# Patient Record
Sex: Female | Born: 1950 | Race: Black or African American | Hispanic: No | Marital: Married | Smoking: Never smoker
Health system: Southern US, Community
[De-identification: ages and names within clinical notes are randomized; demographics above are authoritative.]

## PROBLEM LIST (undated history)

## (undated) DIAGNOSIS — C801 Malignant (primary) neoplasm, unspecified: Secondary | ICD-10-CM

## (undated) DIAGNOSIS — R7303 Prediabetes: Secondary | ICD-10-CM

## (undated) DIAGNOSIS — E785 Hyperlipidemia, unspecified: Secondary | ICD-10-CM

## (undated) DIAGNOSIS — K5792 Diverticulitis of intestine, part unspecified, without perforation or abscess without bleeding: Secondary | ICD-10-CM

## (undated) DIAGNOSIS — I10 Essential (primary) hypertension: Secondary | ICD-10-CM

## (undated) DIAGNOSIS — C50919 Malignant neoplasm of unspecified site of unspecified female breast: Secondary | ICD-10-CM

## (undated) DIAGNOSIS — Z923 Personal history of irradiation: Secondary | ICD-10-CM

## (undated) HISTORY — PX: ABDOMINAL HYSTERECTOMY: SHX81

## (undated) HISTORY — DX: Prediabetes: R73.03

---

## 2004-10-16 ENCOUNTER — Ambulatory Visit: Payer: Self-pay | Admitting: Internal Medicine

## 2004-11-16 ENCOUNTER — Ambulatory Visit: Payer: Self-pay

## 2005-10-24 ENCOUNTER — Ambulatory Visit: Payer: Self-pay

## 2006-11-07 ENCOUNTER — Ambulatory Visit: Payer: Self-pay

## 2007-06-19 ENCOUNTER — Ambulatory Visit: Payer: Self-pay | Admitting: Gastroenterology

## 2007-12-30 ENCOUNTER — Ambulatory Visit: Payer: Self-pay

## 2009-01-18 ENCOUNTER — Ambulatory Visit: Payer: Self-pay

## 2010-02-01 ENCOUNTER — Ambulatory Visit: Payer: Self-pay | Admitting: Obstetrics and Gynecology

## 2010-09-28 ENCOUNTER — Observation Stay: Payer: Self-pay | Admitting: Family Medicine

## 2011-04-11 ENCOUNTER — Ambulatory Visit: Payer: Self-pay | Admitting: Obstetrics and Gynecology

## 2012-04-14 ENCOUNTER — Ambulatory Visit: Payer: Self-pay | Admitting: Obstetrics and Gynecology

## 2012-04-23 ENCOUNTER — Ambulatory Visit: Payer: Self-pay | Admitting: Obstetrics and Gynecology

## 2012-11-11 ENCOUNTER — Ambulatory Visit: Payer: Self-pay | Admitting: Family Medicine

## 2013-04-21 ENCOUNTER — Ambulatory Visit: Payer: Self-pay | Admitting: Obstetrics and Gynecology

## 2014-06-28 ENCOUNTER — Ambulatory Visit: Payer: Self-pay | Admitting: Obstetrics and Gynecology

## 2014-06-30 DIAGNOSIS — E7800 Pure hypercholesterolemia, unspecified: Secondary | ICD-10-CM | POA: Insufficient documentation

## 2014-06-30 DIAGNOSIS — I10 Essential (primary) hypertension: Secondary | ICD-10-CM | POA: Insufficient documentation

## 2014-07-22 DIAGNOSIS — R7303 Prediabetes: Secondary | ICD-10-CM | POA: Insufficient documentation

## 2014-08-12 ENCOUNTER — Encounter (HOSPITAL_COMMUNITY): Payer: Self-pay | Admitting: Pharmacy Technician

## 2014-08-18 NOTE — Patient Instructions (Signed)
Your procedure is scheduled on: 08/25/2014  Report to [facility] at  800  AM.  Call this number if you have problems the morning of surgery: [phone]   Do not eat food or drink liquids :After Midnight.      Take these medicines the morning of surgery with A SIP OF WATER: lisinopril   Do not wear jewelry, make-up or nail polish.  Do not wear lotions, powders, or perfumes.   Do not shave 48 hours prior to surgery.  Do not bring valuables to the hospital.  Contacts, dentures or bridgework may not be worn into surgery.  Leave suitcase in the car. After surgery it may be brought to your room.  For patients admitted to the hospital, checkout time is 11:00 AM the day of discharge.   Patients discharged the day of surgery will not be allowed to drive home.  :     Please read over the following fact sheets that you were given: Coughing and Deep Breathing, Surgical Site Infection Prevention, Anesthesia Post-op Instructions and Care and Recovery After Surgery    Cataract A cataract is a clouding of the lens of the eye. When a lens becomes cloudy, vision is reduced based on the degree and nature of the clouding. Many cataracts reduce vision to some degree. Some cataracts make people more near-sighted as they develop. Other cataracts increase glare. Cataracts that are ignored and become worse can sometimes look white. The white color can be seen through the pupil. CAUSES   Aging. However, cataracts may occur at any age, even in newborns.   Certain drugs.   Trauma to the eye.   Certain diseases such as diabetes.   Specific eye diseases such as chronic inflammation inside the eye or a sudden attack of a rare form of glaucoma.   Inherited or acquired medical problems.  SYMPTOMS   Gradual, progressive drop in vision in the affected eye.   Severe, rapid visual loss. This most often happens when trauma is the cause.  DIAGNOSIS  To detect a cataract, an eye doctor examines the lens. Cataracts  are best diagnosed with an exam of the eyes with the pupils enlarged (dilated) by drops.  TREATMENT  For an early cataract, vision may improve by using different eyeglasses or stronger lighting. If that does not help your vision, surgery is the only effective treatment. A cataract needs to be surgically removed when vision loss interferes with your everyday activities, such as driving, reading, or watching TV. A cataract may also have to be removed if it prevents examination or treatment of another eye problem. Surgery removes the cloudy lens and usually replaces it with a substitute lens (intraocular lens, IOL).  At a time when both you and your doctor agree, the cataract will be surgically removed. If you have cataracts in both eyes, only one is usually removed at a time. This allows the operated eye to heal and be out of danger from any possible problems after surgery (such as infection or poor wound healing). In rare cases, a cataract may be doing damage to your eye. In these cases, your caregiver may advise surgical removal right away. The vast majority of people who have cataract surgery have better vision afterward. HOME CARE INSTRUCTIONS  If you are not planning surgery, you may be asked to do the following:  Use different eyeglasses.   Use stronger or brighter lighting.   Ask your eye doctor about reducing your medicine dose or changing medicines if  it is thought that a medicine caused your cataract. Changing medicines does not make the cataract go away on its own.   Become familiar with your surroundings. Poor vision can lead to injury. Avoid bumping into things on the affected side. You are at a higher risk for tripping or falling.   Exercise extreme care when driving or operating machinery.   Wear sunglasses if you are sensitive to bright light or experiencing problems with glare.  SEEK IMMEDIATE MEDICAL CARE IF:   You have a worsening or sudden vision loss.   You notice redness,  swelling, or increasing pain in the eye.   You have a fever.  Document Released: 12/16/2005 Document Revised: 12/05/2011 Document Reviewed: 08/09/2011 [facility] Patient Information 2012 [place].PATIENT INSTRUCTIONS POST-ANESTHESIA  IMMEDIATELY FOLLOWING SURGERY:  Do not drive or operate machinery for the first twenty four hours after surgery.  Do not make any important decisions for twenty four hours after surgery or while taking narcotic pain medications or sedatives.  If you develop intractable nausea and vomiting or a severe headache please notify your doctor immediately.  FOLLOW-UP:  Please make an appointment with your surgeon as instructed. You do not need to follow up with anesthesia unless specifically instructed to do so.  WOUND CARE INSTRUCTIONS (if applicable):  Keep a dry clean dressing on the anesthesia/puncture wound site if there is drainage.  Once the wound has quit draining you may leave it open to air.  Generally you should leave the bandage intact for twenty four hours unless there is drainage.  If the epidural site drains for more than 36-48 hours please call the anesthesia department.  QUESTIONS?:  Please feel free to call your physician or the hospital operator if you have any questions, and they will be happy to assist you.

## 2014-08-19 ENCOUNTER — Encounter (HOSPITAL_COMMUNITY)
Admission: RE | Admit: 2014-08-19 | Discharge: 2014-08-19 | Disposition: A | Discharge status: Home / Self Care | Payer: BC Managed Care – PPO | Source: Ambulatory Visit | Attending: Ophthalmology | Admitting: Ophthalmology

## 2014-08-19 ENCOUNTER — Encounter (HOSPITAL_COMMUNITY): Payer: Self-pay

## 2014-08-19 DIAGNOSIS — Z01812 Encounter for preprocedural laboratory examination: Secondary | ICD-10-CM | POA: Insufficient documentation

## 2014-08-19 DIAGNOSIS — H2589 Other age-related cataract: Secondary | ICD-10-CM | POA: Diagnosis not present

## 2014-08-19 DIAGNOSIS — Z01818 Encounter for other preprocedural examination: Secondary | ICD-10-CM | POA: Diagnosis not present

## 2014-08-19 DIAGNOSIS — I10 Essential (primary) hypertension: Secondary | ICD-10-CM | POA: Insufficient documentation

## 2014-08-19 DIAGNOSIS — Z01810 Encounter for preprocedural cardiovascular examination: Secondary | ICD-10-CM | POA: Insufficient documentation

## 2014-08-19 HISTORY — DX: Essential (primary) hypertension: I10

## 2014-08-19 LAB — BASIC METABOLIC PANEL
Anion gap: 12 (ref 5–15)
BUN: 12 mg/dL (ref 6–23)
CHLORIDE: 101 meq/L (ref 96–112)
CO2: 28 mEq/L (ref 19–32)
Calcium: 10.4 mg/dL (ref 8.4–10.5)
Creatinine, Ser: 1.04 mg/dL (ref 0.50–1.10)
GFR, EST AFRICAN AMERICAN: 65 mL/min — AB (ref >90)
GFR, EST NON AFRICAN AMERICAN: 56 mL/min — AB (ref >90)
Glucose, Bld: 86 mg/dL (ref 70–99)
Potassium: 3.9 mEq/L (ref 3.7–5.3)
SODIUM: 141 meq/L (ref 137–147)

## 2014-08-19 LAB — HEMOGLOBIN AND HEMATOCRIT, BLOOD
HCT: 40.9 % (ref 36.0–46.0)
Hemoglobin: 13.5 g/dL (ref 12.0–15.0)

## 2014-08-24 MED ORDER — TETRACAINE HCL 0.5 % OP SOLN
OPHTHALMIC | Status: AC
  Filled 2014-08-24: qty 2

## 2014-08-24 MED ORDER — NEOMYCIN-POLYMYXIN-DEXAMETH 3.5-10000-0.1 OP SUSP
OPHTHALMIC | Status: AC
  Filled 2014-08-24: qty 5

## 2014-08-24 MED ORDER — CYCLOPENTOLATE-PHENYLEPHRINE OP SOLN OPTIME - NO CHARGE
OPHTHALMIC | Status: AC
  Filled 2014-08-24: qty 2

## 2014-08-24 MED ORDER — PHENYLEPHRINE HCL 2.5 % OP SOLN
OPHTHALMIC | Status: AC
  Filled 2014-08-24: qty 15

## 2014-08-24 MED ORDER — LIDOCAINE HCL (PF) 1 % IJ SOLN
INTRAMUSCULAR | Status: AC
  Filled 2014-08-24: qty 2

## 2014-08-24 MED ORDER — LIDOCAINE HCL 3.5 % OP GEL
OPHTHALMIC | Status: AC
  Filled 2014-08-24: qty 1

## 2014-08-25 ENCOUNTER — Ambulatory Visit (HOSPITAL_COMMUNITY)
Admission: RE | Admit: 2014-08-25 | Discharge: 2014-08-25 | Disposition: A | Discharge status: Home / Self Care | Payer: BC Managed Care – PPO | Source: Ambulatory Visit | Attending: Ophthalmology | Admitting: Ophthalmology

## 2014-08-25 ENCOUNTER — Encounter (HOSPITAL_COMMUNITY): Payer: BC Managed Care – PPO | Admitting: Anesthesiology

## 2014-08-25 ENCOUNTER — Encounter (HOSPITAL_COMMUNITY): Payer: Self-pay | Admitting: *Deleted

## 2014-08-25 ENCOUNTER — Ambulatory Visit (HOSPITAL_COMMUNITY): Payer: BC Managed Care – PPO | Admitting: Anesthesiology

## 2014-08-25 ENCOUNTER — Encounter (HOSPITAL_COMMUNITY)
Admission: RE | Disposition: A | Discharge status: Home / Self Care | Payer: Self-pay | Source: Ambulatory Visit | Attending: Ophthalmology

## 2014-08-25 DIAGNOSIS — Z79899 Other long term (current) drug therapy: Secondary | ICD-10-CM | POA: Insufficient documentation

## 2014-08-25 DIAGNOSIS — H2589 Other age-related cataract: Secondary | ICD-10-CM | POA: Insufficient documentation

## 2014-08-25 DIAGNOSIS — Z7982 Long term (current) use of aspirin: Secondary | ICD-10-CM | POA: Insufficient documentation

## 2014-08-25 DIAGNOSIS — I10 Essential (primary) hypertension: Secondary | ICD-10-CM | POA: Diagnosis not present

## 2014-08-25 HISTORY — PX: CATARACT EXTRACTION W/PHACO: SHX586

## 2014-08-25 SURGERY — PHACOEMULSIFICATION, CATARACT, WITH IOL INSERTION
Anesthesia: Monitor Anesthesia Care | Site: Eye | Laterality: Right

## 2014-08-25 MED ORDER — FENTANYL CITRATE 0.05 MG/ML IJ SOLN
INTRAMUSCULAR | Status: AC
  Filled 2014-08-25: qty 2

## 2014-08-25 MED ORDER — PROVISC 10 MG/ML IO SOLN
INTRAOCULAR | Status: DC | PRN
  Administered 2014-08-25: 0.85 mL via INTRAOCULAR

## 2014-08-25 MED ORDER — PHENYLEPHRINE HCL 2.5 % OP SOLN
1.0000 [drp] | OPHTHALMIC | Status: AC
  Administered 2014-08-25 (×3): 1 [drp] via OPHTHALMIC

## 2014-08-25 MED ORDER — CYCLOPENTOLATE-PHENYLEPHRINE 0.2-1 % OP SOLN
1.0000 [drp] | OPHTHALMIC | Status: AC
  Administered 2014-08-25 (×3): 1 [drp] via OPHTHALMIC

## 2014-08-25 MED ORDER — FENTANYL CITRATE 0.05 MG/ML IJ SOLN
25.0000 ug | INTRAMUSCULAR | Status: AC
  Administered 2014-08-25 (×2): 25 ug via INTRAVENOUS

## 2014-08-25 MED ORDER — NEOMYCIN-POLYMYXIN-DEXAMETH 3.5-10000-0.1 OP SUSP
OPHTHALMIC | Status: DC | PRN
  Administered 2014-08-25: 2 [drp] via OPHTHALMIC

## 2014-08-25 MED ORDER — LIDOCAINE HCL 3.5 % OP GEL
1.0000 "application " | Freq: Once | OPHTHALMIC | Status: AC
  Administered 2014-08-25: 1 via OPHTHALMIC

## 2014-08-25 MED ORDER — TETRACAINE HCL 0.5 % OP SOLN
1.0000 [drp] | OPHTHALMIC | Status: AC | PRN
  Administered 2014-08-25 (×3): 1 [drp] via OPHTHALMIC

## 2014-08-25 MED ORDER — EPINEPHRINE HCL 1 MG/ML IJ SOLN
INTRAMUSCULAR | Status: AC
  Filled 2014-08-25: qty 1

## 2014-08-25 MED ORDER — LIDOCAINE HCL (PF) 1 % IJ SOLN
INTRAMUSCULAR | Status: DC | PRN
  Administered 2014-08-25: .4 mL

## 2014-08-25 MED ORDER — LACTATED RINGERS IV SOLN
INTRAVENOUS | Status: DC
  Administered 2014-08-25: 1000 mL via INTRAVENOUS

## 2014-08-25 MED ORDER — LIDOCAINE 3.5 % OP GEL OPTIME - NO CHARGE
OPHTHALMIC | Status: DC | PRN
  Administered 2014-08-25: 1 [drp] via OPHTHALMIC

## 2014-08-25 MED ORDER — EPINEPHRINE HCL 1 MG/ML IJ SOLN
INTRAOCULAR | Status: DC | PRN
  Administered 2014-08-25: 09:00:00

## 2014-08-25 MED ORDER — POVIDONE-IODINE 5 % OP SOLN
OPHTHALMIC | Status: DC | PRN
  Administered 2014-08-25: 1 via OPHTHALMIC

## 2014-08-25 MED ORDER — MIDAZOLAM HCL 2 MG/2ML IJ SOLN
1.0000 mg | INTRAMUSCULAR | Status: DC | PRN
  Administered 2014-08-25: 2 mg via INTRAVENOUS

## 2014-08-25 MED ORDER — MIDAZOLAM HCL 2 MG/2ML IJ SOLN
INTRAMUSCULAR | Status: AC
  Filled 2014-08-25: qty 2

## 2014-08-25 MED ORDER — BSS IO SOLN
INTRAOCULAR | Status: DC | PRN
  Administered 2014-08-25: 15 mL

## 2014-08-25 SURGICAL SUPPLY — 34 items
CAPSULAR TENSION RING-AMO (OPHTHALMIC RELATED) IMPLANT
CLOTH BEACON ORANGE TIMEOUT ST (SAFETY) ×3 IMPLANT
EYE SHIELD UNIVERSAL CLEAR (GAUZE/BANDAGES/DRESSINGS) ×3 IMPLANT
GLOVE BIO SURGEON STRL SZ 6.5 (GLOVE) IMPLANT
GLOVE BIO SURGEONS STRL SZ 6.5 (GLOVE)
GLOVE BIOGEL PI IND STRL 6.5 (GLOVE) IMPLANT
GLOVE BIOGEL PI IND STRL 7.0 (GLOVE) ×1 IMPLANT
GLOVE BIOGEL PI IND STRL 7.5 (GLOVE) IMPLANT
GLOVE BIOGEL PI INDICATOR 6.5 (GLOVE)
GLOVE BIOGEL PI INDICATOR 7.0 (GLOVE) ×2
GLOVE BIOGEL PI INDICATOR 7.5 (GLOVE)
GLOVE ECLIPSE 6.5 STRL STRAW (GLOVE) IMPLANT
GLOVE ECLIPSE 7.0 STRL STRAW (GLOVE) IMPLANT
GLOVE ECLIPSE 7.5 STRL STRAW (GLOVE) IMPLANT
GLOVE EXAM NITRILE LRG STRL (GLOVE) IMPLANT
GLOVE EXAM NITRILE MD LF STRL (GLOVE) ×3 IMPLANT
GLOVE SKINSENSE NS SZ6.5 (GLOVE)
GLOVE SKINSENSE NS SZ7.0 (GLOVE)
GLOVE SKINSENSE STRL SZ6.5 (GLOVE) IMPLANT
GLOVE SKINSENSE STRL SZ7.0 (GLOVE) IMPLANT
KIT VITRECTOMY (OPHTHALMIC RELATED) IMPLANT
PAD ARMBOARD 7.5X6 YLW CONV (MISCELLANEOUS) ×3 IMPLANT
PROC W NO LENS (INTRAOCULAR LENS)
PROC W SPEC LENS (INTRAOCULAR LENS)
PROCESS W NO LENS (INTRAOCULAR LENS) IMPLANT
PROCESS W SPEC LENS (INTRAOCULAR LENS) IMPLANT
RETRACTOR IRIS SIGHTPATH (OPHTHALMIC RELATED) IMPLANT
RING MALYGIN (MISCELLANEOUS) IMPLANT
SIGHTPATH CAT PROC W REG LENS (Ophthalmic Related) ×3 IMPLANT
SYRINGE LUER LOK 1CC (MISCELLANEOUS) ×3 IMPLANT
TAPE SURG TRANSPORE 1 IN (GAUZE/BANDAGES/DRESSINGS) ×1 IMPLANT
TAPE SURGICAL TRANSPORE 1 IN (GAUZE/BANDAGES/DRESSINGS) ×2
VISCOELASTIC ADDITIONAL (OPHTHALMIC RELATED) IMPLANT
WATER STERILE IRR 250ML POUR (IV SOLUTION) ×3 IMPLANT

## 2014-08-25 NOTE — Transfer of Care (Signed)
Immediate Anesthesia Transfer of Care Note  Patient: Laura Johns  Procedure(s) Performed: Procedure(s) with comments: CATARACT EXTRACTION PHACO AND INTRAOCULAR LENS PLACEMENT (IOC) (Right) - CDE 5.82  Patient Location: Short Stay  Anesthesia Type:MAC  Level of Consciousness: awake, alert , oriented and patient cooperative  Airway & Oxygen Therapy: Patient Spontanous Breathing  Post-op Assessment: Report given to PACU RN, Post -op Vital signs reviewed and stable and Patient moving all extremities  Post vital signs: Reviewed and stable  Complications: No apparent anesthesia complications

## 2014-08-25 NOTE — Op Note (Signed)
Date of Admission: 08/25/2014  Date of Surgery: 08/25/2014   Pre-Op Dx: Cataract Right Eye  Post-Op Dx: Senile Combined  Cataract Right  Eye,  Dx Code 366.19  Surgeon: Tonny Branch, M.D.  Assistants: None  Anesthesia: Topical with MAC  Indications: Painless, progressive loss of vision with compromise of daily activities.  Surgery: Cataract Extraction with Intraocular lens Implant Right Eye  Discription: The patient had dilating drops and viscous lidocaine placed into the Right eye in the pre-op holding area. After transfer to the operating room, a time out was performed. The patient was then prepped and draped. Beginning with a 50 degree blade a paracentesis port was made at the surgeon's 2 o'clock position. The anterior chamber was then filled with 1% non-preserved lidocaine. This was followed by filling the anterior chamber with Provisc.  A 2.54mm keratome blade was used to make a clear corneal incision at the temporal limbus.  A bent cystatome needle was used to create a continuous tear capsulotomy. Hydrodissection was performed with balanced salt solution on a Fine canula. The lens nucleus was then removed using the phacoemulsification handpiece. Residual cortex was removed with the I&A handpiece. The anterior chamber and capsular bag were refilled with Provisc. A posterior chamber intraocular lens was placed into the capsular bag with it's injector. The implant was positioned with the Kuglan hook. The Provisc was then removed from the anterior chamber and capsular bag with the I&A handpiece. Stromal hydration of the main incision and paracentesis port was performed with BSS on a Fine canula. The wounds were tested for leak which was negative. The patient tolerated the procedure well. There were no operative complications. The patient was then transferred to the recovery room in stable condition.  Complications: None  Specimen: None  EBL: None  Prosthetic device: Hoya iSert 250, power 18.5  D, SN [id number].

## 2014-08-25 NOTE — Anesthesia Postprocedure Evaluation (Signed)
  Anesthesia Post-op Note  Patient: Laura Johns  Procedure(s) Performed: Procedure(s) with comments: CATARACT EXTRACTION PHACO AND INTRAOCULAR LENS PLACEMENT (IOC) (Right) - CDE 5.82  Patient Location: Short Stay  Anesthesia Type:MAC  Level of Consciousness: awake, alert , oriented and patient cooperative  Airway and Oxygen Therapy: Patient Spontanous Breathing  Post-op Pain: none  Post-op Assessment: Post-op Vital signs reviewed, Patient's Cardiovascular Status Stable, Respiratory Function Stable and Patent Airway  Post-op Vital Signs: Reviewed and stable  Last Vitals:  Filed Vitals:   08/25/14 0845  BP: 128/70  Pulse:   Temp:   Resp: 18    Complications: No apparent anesthesia complications

## 2014-08-25 NOTE — Anesthesia Preprocedure Evaluation (Addendum)
Anesthesia Evaluation  Patient identified by MRN, date of birth, ID band Patient awake    Reviewed: Allergy & Precautions, H&P , NPO status , Patient's Chart, lab work & pertinent test results  Airway Mallampati: II TM Distance: >3 FB     Dental  (+) Teeth Intact   Pulmonary neg pulmonary ROS,  breath sounds clear to auscultation        Cardiovascular hypertension, Pt. on medications Rhythm:Regular Rate:Normal     Neuro/Psych    GI/Hepatic negative GI ROS,   Endo/Other    Renal/GU      Musculoskeletal   Abdominal   Peds  Hematology   Anesthesia Other Findings   Reproductive/Obstetrics                          Anesthesia Physical Anesthesia Plan  ASA: II  Anesthesia Plan: MAC   Post-op Pain Management:    Induction: Intravenous  Airway Management Planned: Nasal Cannula  Additional Equipment:   Intra-op Plan:   Post-operative Plan:   Informed Consent: I have reviewed the patients History and Physical, chart, labs and discussed the procedure including the risks, benefits and alternatives for the proposed anesthesia with the patient or authorized representative who has indicated his/her understanding and acceptance.     Plan Discussed with:   Anesthesia Plan Comments:         Anesthesia Quick Evaluation

## 2014-08-25 NOTE — Discharge Instructions (Signed)

## 2014-08-25 NOTE — H&P (Signed)
I have reviewed the H&P, the patient was re-examined, and I have identified no interval changes in medical condition and plan of care since the history and physical of record  

## 2014-08-26 ENCOUNTER — Encounter (HOSPITAL_COMMUNITY): Payer: Self-pay | Admitting: Ophthalmology

## 2015-04-26 ENCOUNTER — Other Ambulatory Visit: Payer: Self-pay | Admitting: Obstetrics and Gynecology

## 2015-04-26 DIAGNOSIS — Z1231 Encounter for screening mammogram for malignant neoplasm of breast: Secondary | ICD-10-CM

## 2015-07-04 ENCOUNTER — Ambulatory Visit
Admission: RE | Admit: 2015-07-04 | Discharge: 2015-07-04 | Disposition: A | Discharge status: Home / Self Care | Payer: BLUE CROSS/BLUE SHIELD | Source: Ambulatory Visit | Attending: Obstetrics and Gynecology | Admitting: Obstetrics and Gynecology

## 2015-07-04 DIAGNOSIS — Z1231 Encounter for screening mammogram for malignant neoplasm of breast: Secondary | ICD-10-CM | POA: Diagnosis present

## 2016-05-30 ENCOUNTER — Other Ambulatory Visit: Payer: Self-pay | Admitting: Obstetrics and Gynecology

## 2016-05-30 DIAGNOSIS — Z1231 Encounter for screening mammogram for malignant neoplasm of breast: Secondary | ICD-10-CM

## 2016-07-04 ENCOUNTER — Other Ambulatory Visit: Payer: Self-pay | Admitting: Obstetrics and Gynecology

## 2016-07-04 ENCOUNTER — Ambulatory Visit
Admission: RE | Admit: 2016-07-04 | Discharge: 2016-07-04 | Disposition: A | Discharge status: Home / Self Care | Payer: BLUE CROSS/BLUE SHIELD | Source: Ambulatory Visit | Attending: Obstetrics and Gynecology | Admitting: Obstetrics and Gynecology

## 2016-07-04 DIAGNOSIS — Z1231 Encounter for screening mammogram for malignant neoplasm of breast: Secondary | ICD-10-CM | POA: Diagnosis present

## 2017-06-11 ENCOUNTER — Other Ambulatory Visit: Payer: Self-pay | Admitting: Obstetrics and Gynecology

## 2017-06-11 DIAGNOSIS — Z1231 Encounter for screening mammogram for malignant neoplasm of breast: Secondary | ICD-10-CM

## 2017-07-07 ENCOUNTER — Ambulatory Visit
Admission: RE | Admit: 2017-07-07 | Discharge: 2017-07-07 | Disposition: A | Discharge status: Home / Self Care | Payer: BLUE CROSS/BLUE SHIELD | Source: Ambulatory Visit | Attending: Obstetrics and Gynecology | Admitting: Obstetrics and Gynecology

## 2017-07-07 DIAGNOSIS — Z1231 Encounter for screening mammogram for malignant neoplasm of breast: Secondary | ICD-10-CM | POA: Insufficient documentation

## 2017-07-10 ENCOUNTER — Other Ambulatory Visit: Payer: Self-pay | Admitting: Physician Assistant

## 2017-07-10 DIAGNOSIS — R1030 Lower abdominal pain, unspecified: Secondary | ICD-10-CM

## 2017-07-10 DIAGNOSIS — D72829 Elevated white blood cell count, unspecified: Secondary | ICD-10-CM

## 2017-07-11 ENCOUNTER — Ambulatory Visit
Admission: RE | Admit: 2017-07-11 | Discharge: 2017-07-11 | Disposition: A | Discharge status: Home / Self Care | Payer: BLUE CROSS/BLUE SHIELD | Source: Ambulatory Visit | Attending: Physician Assistant | Admitting: Physician Assistant

## 2017-07-11 DIAGNOSIS — R1032 Left lower quadrant pain: Secondary | ICD-10-CM | POA: Insufficient documentation

## 2017-07-11 DIAGNOSIS — D72829 Elevated white blood cell count, unspecified: Secondary | ICD-10-CM | POA: Insufficient documentation

## 2017-07-11 DIAGNOSIS — I700 Atherosclerosis of aorta: Secondary | ICD-10-CM | POA: Insufficient documentation

## 2017-07-11 DIAGNOSIS — I2510 Atherosclerotic heart disease of native coronary artery without angina pectoris: Secondary | ICD-10-CM | POA: Diagnosis not present

## 2017-07-11 DIAGNOSIS — K5732 Diverticulitis of large intestine without perforation or abscess without bleeding: Secondary | ICD-10-CM | POA: Diagnosis not present

## 2017-07-11 DIAGNOSIS — Z90710 Acquired absence of both cervix and uterus: Secondary | ICD-10-CM | POA: Diagnosis not present

## 2017-07-11 DIAGNOSIS — R1030 Lower abdominal pain, unspecified: Secondary | ICD-10-CM

## 2017-07-11 DIAGNOSIS — R1031 Right lower quadrant pain: Secondary | ICD-10-CM | POA: Diagnosis present

## 2017-07-11 MED ORDER — IOPAMIDOL (ISOVUE-300) INJECTION 61%: 100.0000 mL | Freq: Once | INTRAVENOUS | Status: DC | PRN

## 2017-07-12 ENCOUNTER — Emergency Department
Admission: EM | Admit: 2017-07-12 | Discharge: 2017-07-12 | Disposition: A | Discharge status: Home / Self Care | Payer: BLUE CROSS/BLUE SHIELD | Attending: Student in an Organized Health Care Education/Training Program | Admitting: Student in an Organized Health Care Education/Training Program

## 2017-07-12 ENCOUNTER — Emergency Department: Payer: BLUE CROSS/BLUE SHIELD

## 2017-07-12 ENCOUNTER — Encounter: Payer: Self-pay | Admitting: Emergency Medicine

## 2017-07-12 DIAGNOSIS — R1030 Lower abdominal pain, unspecified: Secondary | ICD-10-CM | POA: Diagnosis present

## 2017-07-12 DIAGNOSIS — Z79899 Other long term (current) drug therapy: Secondary | ICD-10-CM | POA: Insufficient documentation

## 2017-07-12 DIAGNOSIS — Z7982 Long term (current) use of aspirin: Secondary | ICD-10-CM | POA: Insufficient documentation

## 2017-07-12 DIAGNOSIS — K5792 Diverticulitis of intestine, part unspecified, without perforation or abscess without bleeding: Secondary | ICD-10-CM

## 2017-07-12 DIAGNOSIS — I10 Essential (primary) hypertension: Secondary | ICD-10-CM | POA: Diagnosis not present

## 2017-07-12 DIAGNOSIS — R109 Unspecified abdominal pain: Secondary | ICD-10-CM

## 2017-07-12 DIAGNOSIS — K5732 Diverticulitis of large intestine without perforation or abscess without bleeding: Secondary | ICD-10-CM | POA: Insufficient documentation

## 2017-07-12 HISTORY — DX: Hyperlipidemia, unspecified: E78.5

## 2017-07-12 LAB — URINALYSIS, COMPLETE (UACMP) WITH MICROSCOPIC
BACTERIA UA: NONE SEEN
Bilirubin Urine: NEGATIVE
Glucose, UA: NEGATIVE mg/dL
Hgb urine dipstick: NEGATIVE
KETONES UR: 5 mg/dL — AB
Nitrite: NEGATIVE
PROTEIN: NEGATIVE mg/dL
Specific Gravity, Urine: 1.015 (ref 1.005–1.030)
pH: 6 (ref 5.0–8.0)

## 2017-07-12 LAB — COMPREHENSIVE METABOLIC PANEL
ALK PHOS: 52 U/L (ref 38–126)
ALT: 13 U/L — ABNORMAL LOW (ref 14–54)
ANION GAP: 10 (ref 5–15)
AST: 19 U/L (ref 15–41)
Albumin: 4.1 g/dL (ref 3.5–5.0)
BUN: 14 mg/dL (ref 6–20)
CALCIUM: 10.6 mg/dL — AB (ref 8.9–10.3)
CHLORIDE: 99 mmol/L — AB (ref 101–111)
CO2: 27 mmol/L (ref 22–32)
Creatinine, Ser: 0.97 mg/dL (ref 0.44–1.00)
GFR calc Af Amer: >60 mL/min (ref >60)
GFR calc non Af Amer: 60 mL/min — ABNORMAL LOW (ref >60)
GLUCOSE: 119 mg/dL — AB (ref 65–99)
Potassium: 3.7 mmol/L (ref 3.5–5.1)
SODIUM: 136 mmol/L (ref 135–145)
Total Bilirubin: 0.9 mg/dL (ref 0.3–1.2)
Total Protein: 7.9 g/dL (ref 6.5–8.1)

## 2017-07-12 LAB — CBC
HEMATOCRIT: 41.5 % (ref 35.0–47.0)
HEMOGLOBIN: 13.7 g/dL (ref 12.0–16.0)
MCH: 25.7 pg — AB (ref 26.0–34.0)
MCHC: 33.1 g/dL (ref 32.0–36.0)
MCV: 77.5 fL — AB (ref 80.0–100.0)
Platelets: 287 10*3/uL (ref 150–440)
RBC: 5.35 MIL/uL — ABNORMAL HIGH (ref 3.80–5.20)
RDW: 14.1 % (ref 11.5–14.5)
WBC: 8.7 10*3/uL (ref 3.6–11.0)

## 2017-07-12 LAB — LIPASE, BLOOD: Lipase: 20 U/L (ref 11–51)

## 2017-07-12 MED ORDER — HYDROCODONE-ACETAMINOPHEN 5-325 MG PO TABS
1.0000 | ORAL_TABLET | Freq: Four times a day (QID) | ORAL | Status: DC | PRN
  Administered 2017-07-12: 1 via ORAL
  Filled 2017-07-12: qty 1

## 2017-07-12 MED ORDER — MORPHINE SULFATE (PF) 4 MG/ML IV SOLN: 4.0000 mg | INTRAVENOUS | Status: DC | PRN

## 2017-07-12 MED ORDER — TRAMADOL HCL 50 MG PO TABS: 50.0000 mg | ORAL_TABLET | Freq: Four times a day (QID) | ORAL | 0 refills | Status: DC | PRN

## 2017-07-12 MED ORDER — ONDANSETRON HCL 4 MG/2ML IJ SOLN
4.0000 mg | Freq: Once | INTRAMUSCULAR | Status: AC
  Administered 2017-07-12: 4 mg via INTRAVENOUS
  Filled 2017-07-12: qty 2

## 2017-07-12 MED ORDER — METRONIDAZOLE IN NACL 5-0.79 MG/ML-% IV SOLN
500.0000 mg | Freq: Once | INTRAVENOUS | Status: AC
  Administered 2017-07-12: 500 mg via INTRAVENOUS
  Filled 2017-07-12: qty 100

## 2017-07-12 MED ORDER — SODIUM CHLORIDE 0.9 % IV BOLUS (SEPSIS)
1000.0000 mL | Freq: Once | INTRAVENOUS | Status: AC
  Administered 2017-07-12: 1000 mL via INTRAVENOUS

## 2017-07-12 MED ORDER — KETOROLAC TROMETHAMINE 30 MG/ML IJ SOLN
15.0000 mg | Freq: Once | INTRAMUSCULAR | Status: AC
  Administered 2017-07-12: 15 mg via INTRAVENOUS
  Filled 2017-07-12: qty 1

## 2017-07-12 MED ORDER — ONDANSETRON 4 MG PO TBDP: 4.0000 mg | ORAL_TABLET | Freq: Three times a day (TID) | ORAL | 0 refills | Status: DC | PRN

## 2017-07-12 MED ORDER — CIPROFLOXACIN IN D5W 200 MG/100ML IV SOLN
200.0000 mg | Freq: Once | INTRAVENOUS | Status: AC
  Administered 2017-07-12: 200 mg via INTRAVENOUS
  Filled 2017-07-12: qty 100

## 2017-07-12 NOTE — ED Triage Notes (Signed)
Pt to ED via POV c/o bilateral lower abdominal pain since Tuesday. Pt was diagnosed on Thursday with acute diverticulitis. Pt is currently being treated with Cipro and Flagyl. Pt states that her pain has not improved. Pt states that she had 1 episode of vomiting this morning.

## 2017-07-12 NOTE — ED Notes (Signed)
Pt given blanket.

## 2017-07-12 NOTE — ED Provider Notes (Signed)
[facility] Emergency Department Provider Note    First MD Initiated Contact with Patient 07/12/17 1730     (approximate)  I have reviewed the triage vital signs and the nursing notes.   HISTORY  Chief Complaint Abdominal Pain    HPI Laura Johns is a 66 y.o. female resents with several days of persistent lower abdominal pain.  Patient with recent diagnosis of diverticulitis was started on Flagyl and Cipro but is having persistent pain. No fevers. Is still having some mild diarrhea. Had an episode of vomiting this morning he has not had anything to eat or drink due to pain. Denies any chest pain. No shortness of breath.  Says the pain is a aching sensation.   Past Medical History:  Diagnosis Date  . Hyperlipemia   . Hypertension    Family History  Problem Relation Age of Onset  . Stroke Mother   . Heart attack Father   . Stroke Father   . Breast cancer Neg Hx    Past Surgical History:  Procedure Laterality Date  . ABDOMINAL HYSTERECTOMY    . CATARACT EXTRACTION W/PHACO Right 08/25/2014   Procedure: CATARACT EXTRACTION PHACO AND INTRAOCULAR LENS PLACEMENT (IOC);  Surgeon: Tonny Branch, MD;  Location: [facility];  Service: Ophthalmology;  Laterality: Right;  CDE 5.82   There are no active problems to display for this patient.     Prior to Admission medications   Medication Sig Start Date End Date Taking? Authorizing Provider  aspirin EC 81 MG tablet Take 81 mg by mouth daily.    [provider]  Calcium Carb-Cholecalciferol (CALCIUM 600+D3) 600-800 MG-UNIT TABS Take 1 tablet by mouth daily.     [provider]  hydrochlorothiazide (HYDRODIURIL) 25 MG tablet Take 25 mg by mouth daily.    [provider]  lisinopril (PRINIVIL,ZESTRIL) 10 MG tablet Take 10 mg by mouth daily.    [provider]  lovastatin (MEVACOR) 40 MG tablet Take 40 mg by mouth at bedtime.    [provider]  Multiple Vitamin  (MULTIVITAMIN WITH MINERALS) TABS tablet Take 1 tablet by mouth daily.    [provider]    Allergies Patient has no known allergies.    Social History Social History  Substance Use Topics  . Smoking status: Never Smoker  . Smokeless tobacco: Never Used  . Alcohol use No    Review of Systems Patient denies headaches, rhinorrhea, blurry vision, numbness, shortness of breath, chest pain, edema, cough, abdominal pain, nausea, vomiting, diarrhea, dysuria, fevers, rashes or hallucinations unless otherwise stated above in HPI. ____________________________________________   PHYSICAL EXAM:  VITAL SIGNS: Vitals:   07/12/17 1308 07/12/17 1615  BP: 125/79 122/75  Pulse: 79 62  Resp: 16 18  Temp: 98.3 F (36.8 C)     Constitutional: Alert and oriented. in no acute distress. Eyes: Conjunctivae are normal.  Head: Atraumatic. Nose: No congestion/rhinnorhea. Mouth/Throat: Mucous membranes are moist.   Neck: No stridor. Painless ROM.  Cardiovascular: Normal rate, regular rhythm. Grossly normal heart sounds.  Good peripheral circulation. Respiratory: Normal respiratory effort.  No retractions. Lungs CTAB. Gastrointestinal: Soft with mild r and L LQ abdominal pain without guarding or rebound. No distention. No abdominal bruits. No CVA tenderness. Musculoskeletal: No lower extremity tenderness nor edema.  No joint effusions. Neurologic:  Normal speech and language. No gross focal neurologic deficits are appreciated. No facial droop Skin:  Skin is warm, dry and intact. No rash noted. Psychiatric: Mood and affect  are normal. Speech and behavior are normal.  ____________________________________________   LABS (all labs ordered are listed, but only abnormal results are displayed)  Results for orders placed or performed during the hospital encounter of 07/12/17 (from the past 24 hour(s))  Lipase, blood     Status: None   Collection Time: 07/12/17  1:08 PM  Result Value Ref  Range   Lipase 20 11 - 51 U/L  Comprehensive metabolic panel     Status: Abnormal   Collection Time: 07/12/17  1:08 PM  Result Value Ref Range   Sodium 136 135 - 145 mmol/L   Potassium 3.7 3.5 - 5.1 mmol/L   Chloride 99 (L) 101 - 111 mmol/L   CO2 27 22 - 32 mmol/L   Glucose, Bld 119 (H) 65 - 99 mg/dL   BUN 14 6 - 20 mg/dL   Creatinine, Ser 0.97 0.44 - 1.00 mg/dL   Calcium 10.6 (H) 8.9 - 10.3 mg/dL   Total Protein 7.9 6.5 - 8.1 g/dL   Albumin 4.1 3.5 - 5.0 g/dL   AST 19 15 - 41 U/L   ALT 13 (L) 14 - 54 U/L   Alkaline Phosphatase 52 38 - 126 U/L   Total Bilirubin 0.9 0.3 - 1.2 mg/dL   GFR calc non Af Amer 60 (L) >60 mL/min   GFR calc Af Amer >60 >60 mL/min   Anion gap 10 5 - 15  CBC     Status: Abnormal   Collection Time: 07/12/17  1:08 PM  Result Value Ref Range   WBC 8.7 3.6 - 11.0 K/uL   RBC 5.35 (H) 3.80 - 5.20 MIL/uL   Hemoglobin 13.7 12.0 - 16.0 g/dL   HCT 41.5 35.0 - 47.0 %   MCV 77.5 (L) 80.0 - 100.0 fL   MCH 25.7 (L) 26.0 - 34.0 pg   MCHC 33.1 32.0 - 36.0 g/dL   RDW 14.1 11.5 - 14.5 %   Platelets 287 150 - 440 K/uL  Urinalysis, Complete w Microscopic     Status: Abnormal   Collection Time: 07/12/17  1:08 PM  Result Value Ref Range   Color, Urine YELLOW (A) YELLOW   APPearance CLEAR (A) CLEAR   Specific Gravity, Urine 1.015 1.005 - 1.030   pH 6.0 5.0 - 8.0   Glucose, UA NEGATIVE NEGATIVE mg/dL   Hgb urine dipstick NEGATIVE NEGATIVE   Bilirubin Urine NEGATIVE NEGATIVE   Ketones, ur 5 (A) NEGATIVE mg/dL   Protein, ur NEGATIVE NEGATIVE mg/dL   Nitrite NEGATIVE NEGATIVE   Leukocytes, UA SMALL (A) NEGATIVE   RBC / HPF 0-5 0 - 5 RBC/hpf   WBC, UA 0-5 0 - 5 WBC/hpf   Bacteria, UA NONE SEEN NONE SEEN   Squamous Epithelial / LPF 0-5 (A) NONE SEEN   Mucous PRESENT    ____________________________________________  __________________________________  RADIOLOGY  I personally reviewed all radiographic images ordered to evaluate for the above acute complaints and  reviewed radiology reports and findings.  These findings were personally discussed with the patient.  Please see medical record for radiology report.  ____________________________________________   PROCEDURES  Procedure(s) performed:  Procedures    Critical Care performed: no ____________________________________________   INITIAL IMPRESSION / ASSESSMENT AND PLAN / ED COURSE  Pertinent labs & imaging results that were available during my care of the patient were reviewed by me and considered in my medical decision making (see chart for details).  DDX: diverticulitis, appendicitis, colitis, constipation, uti  SHARISA TOVES is a  66 y.o. who presents to the ED with persistent abdominal pain is described above. Patient afebrile and hemodynamically stable. She is well-appearing. Does have some mild discomfort but no peritonitis. As her persistent symptoms will order CT imaging to further evaluate for perforation or abscess however would be unlikely in the setting of her tolerating her antibiotics with reassuring and improving blood work. We'll provide IV medication.  Clinical Course as of Jul 12 2117  Sat Jul 12, 2017  2033 Patient reassessed. Remains afebrile hemodynamically stable. Blood work is improving. CT imaging shows no evidence of abscess or perforation. This point I do feel the patient is appropriate for continued outpatient management. Discussed signs and symptoms for which she should return to the ER.  [PR]    Clinical Course User Index [PR] Merlyn Lot, MD     ____________________________________________   FINAL CLINICAL IMPRESSION(S) / ED DIAGNOSES  Final diagnoses:  Diverticulitis  Abdominal pain, unspecified abdominal location      NEW MEDICATIONS STARTED DURING THIS VISIT:  New Prescriptions   No medications on file     Note:  This document was prepared using Dragon voice recognition software and may include unintentional dictation errors.      Merlyn Lot, MD 07/12/17 2118

## 2017-07-12 NOTE — ED Notes (Signed)
Flagyl running, pt declined morphine

## 2017-07-17 ENCOUNTER — Inpatient Hospital Stay
Admission: EM | Admit: 2017-07-17 | Discharge: 2017-07-19 | DRG: 392 | Disposition: A | Discharge status: Home / Self Care | Payer: BLUE CROSS/BLUE SHIELD | Attending: Internal Medicine | Admitting: Internal Medicine

## 2017-07-17 ENCOUNTER — Encounter: Payer: Self-pay | Admitting: Emergency Medicine

## 2017-07-17 DIAGNOSIS — Z91041 Radiographic dye allergy status: Secondary | ICD-10-CM

## 2017-07-17 DIAGNOSIS — Z90710 Acquired absence of both cervix and uterus: Secondary | ICD-10-CM

## 2017-07-17 DIAGNOSIS — K5792 Diverticulitis of intestine, part unspecified, without perforation or abscess without bleeding: Secondary | ICD-10-CM | POA: Diagnosis present

## 2017-07-17 DIAGNOSIS — Z961 Presence of intraocular lens: Secondary | ICD-10-CM | POA: Diagnosis present

## 2017-07-17 DIAGNOSIS — Z8249 Family history of ischemic heart disease and other diseases of the circulatory system: Secondary | ICD-10-CM

## 2017-07-17 DIAGNOSIS — Z885 Allergy status to narcotic agent status: Secondary | ICD-10-CM

## 2017-07-17 DIAGNOSIS — R112 Nausea with vomiting, unspecified: Secondary | ICD-10-CM

## 2017-07-17 DIAGNOSIS — Z7982 Long term (current) use of aspirin: Secondary | ICD-10-CM

## 2017-07-17 DIAGNOSIS — K5732 Diverticulitis of large intestine without perforation or abscess without bleeding: Secondary | ICD-10-CM | POA: Diagnosis not present

## 2017-07-17 DIAGNOSIS — Z886 Allergy status to analgesic agent status: Secondary | ICD-10-CM

## 2017-07-17 DIAGNOSIS — Z823 Family history of stroke: Secondary | ICD-10-CM

## 2017-07-17 DIAGNOSIS — Z9841 Cataract extraction status, right eye: Secondary | ICD-10-CM

## 2017-07-17 DIAGNOSIS — I10 Essential (primary) hypertension: Secondary | ICD-10-CM | POA: Diagnosis present

## 2017-07-17 DIAGNOSIS — N200 Calculus of kidney: Secondary | ICD-10-CM | POA: Diagnosis present

## 2017-07-17 DIAGNOSIS — E785 Hyperlipidemia, unspecified: Secondary | ICD-10-CM | POA: Diagnosis present

## 2017-07-17 HISTORY — DX: Diverticulitis of intestine, part unspecified, without perforation or abscess without bleeding: K57.92

## 2017-07-17 LAB — CBC
HCT: 40.7 % (ref 35.0–47.0)
Hemoglobin: 13.4 g/dL (ref 12.0–16.0)
MCH: 25.2 pg — ABNORMAL LOW (ref 26.0–34.0)
MCHC: 33 g/dL (ref 32.0–36.0)
MCV: 76.4 fL — AB (ref 80.0–100.0)
PLATELETS: 340 10*3/uL (ref 150–440)
RBC: 5.33 MIL/uL — ABNORMAL HIGH (ref 3.80–5.20)
RDW: 13.8 % (ref 11.5–14.5)
WBC: 5.6 10*3/uL (ref 3.6–11.0)

## 2017-07-17 LAB — LIPASE, BLOOD: Lipase: 17 U/L (ref 11–51)

## 2017-07-17 LAB — COMPREHENSIVE METABOLIC PANEL
ALBUMIN: 3.8 g/dL (ref 3.5–5.0)
ALT: 19 U/L (ref 14–54)
AST: 32 U/L (ref 15–41)
Alkaline Phosphatase: 46 U/L (ref 38–126)
Anion gap: 14 (ref 5–15)
BILIRUBIN TOTAL: 0.7 mg/dL (ref 0.3–1.2)
BUN: 12 mg/dL (ref 6–20)
CO2: 27 mmol/L (ref 22–32)
CREATININE: 0.94 mg/dL (ref 0.44–1.00)
Calcium: 10.9 mg/dL — ABNORMAL HIGH (ref 8.9–10.3)
Chloride: 95 mmol/L — ABNORMAL LOW (ref 101–111)
GFR calc Af Amer: >60 mL/min (ref >60)
GLUCOSE: 90 mg/dL (ref 65–99)
Potassium: 3.8 mmol/L (ref 3.5–5.1)
Sodium: 136 mmol/L (ref 135–145)
TOTAL PROTEIN: 7.5 g/dL (ref 6.5–8.1)

## 2017-07-17 LAB — URINALYSIS, COMPLETE (UACMP) WITH MICROSCOPIC
BACTERIA UA: NONE SEEN
BILIRUBIN URINE: NEGATIVE
Glucose, UA: NEGATIVE mg/dL
Hgb urine dipstick: NEGATIVE
Ketones, ur: 20 mg/dL — AB
Nitrite: NEGATIVE
PH: 7 (ref 5.0–8.0)
Protein, ur: NEGATIVE mg/dL
SPECIFIC GRAVITY, URINE: 1.005 (ref 1.005–1.030)
SQUAMOUS EPITHELIAL / LPF: NONE SEEN

## 2017-07-17 MED ORDER — OXYCODONE HCL 5 MG PO TABS
5.0000 mg | ORAL_TABLET | ORAL | Status: DC | PRN
  Administered 2017-07-18: 09:00:00 5 mg via ORAL
  Filled 2017-07-17: qty 1

## 2017-07-17 MED ORDER — HYDROCHLOROTHIAZIDE 25 MG PO TABS
25.0000 mg | ORAL_TABLET | Freq: Every day | ORAL | Status: DC
  Administered 2017-07-18 – 2017-07-19 (×2): 25 mg via ORAL
  Filled 2017-07-17 (×2): qty 1

## 2017-07-17 MED ORDER — FENTANYL CITRATE (PF) 100 MCG/2ML IJ SOLN
50.0000 ug | INTRAMUSCULAR | Status: DC | PRN
  Administered 2017-07-17: 50 ug via INTRAVENOUS
  Filled 2017-07-17: qty 2

## 2017-07-17 MED ORDER — SODIUM CHLORIDE 0.9 % IV SOLN
INTRAVENOUS | Status: DC
  Administered 2017-07-17 – 2017-07-18 (×2): via INTRAVENOUS

## 2017-07-17 MED ORDER — ACETAMINOPHEN 650 MG RE SUPP: 650.0000 mg | Freq: Four times a day (QID) | RECTAL | Status: DC | PRN

## 2017-07-17 MED ORDER — SODIUM CHLORIDE 0.9 % IV SOLN
1.0000 g | Freq: Two times a day (BID) | INTRAVENOUS | Status: DC
  Administered 2017-07-17 – 2017-07-18 (×3): 1 g via INTRAVENOUS
  Filled 2017-07-17 (×5): qty 1

## 2017-07-17 MED ORDER — METRONIDAZOLE IN NACL 5-0.79 MG/ML-% IV SOLN
500.0000 mg | Freq: Once | INTRAVENOUS | Status: AC
  Administered 2017-07-17: 500 mg via INTRAVENOUS
  Filled 2017-07-17: qty 100

## 2017-07-17 MED ORDER — ONDANSETRON HCL 4 MG PO TABS
4.0000 mg | ORAL_TABLET | Freq: Four times a day (QID) | ORAL | Status: DC | PRN
Start: 2017-07-17 — End: 2017-07-19

## 2017-07-17 MED ORDER — ONDANSETRON 4 MG PO TBDP: 4.0000 mg | ORAL_TABLET | Freq: Once | ORAL | Status: DC | PRN

## 2017-07-17 MED ORDER — ASPIRIN EC 81 MG PO TBEC
81.0000 mg | DELAYED_RELEASE_TABLET | Freq: Every day | ORAL | Status: DC
  Administered 2017-07-18 – 2017-07-19 (×2): 81 mg via ORAL
  Filled 2017-07-17 (×2): qty 1

## 2017-07-17 MED ORDER — ENOXAPARIN SODIUM 40 MG/0.4ML ~~LOC~~ SOLN
40.0000 mg | SUBCUTANEOUS | Status: DC
  Administered 2017-07-17 – 2017-07-18 (×2): 40 mg via SUBCUTANEOUS
  Filled 2017-07-17 (×2): qty 0.4

## 2017-07-17 MED ORDER — LISINOPRIL 10 MG PO TABS
10.0000 mg | ORAL_TABLET | Freq: Every day | ORAL | Status: DC
  Administered 2017-07-18 – 2017-07-19 (×2): 10 mg via ORAL
  Filled 2017-07-17 (×2): qty 1

## 2017-07-17 MED ORDER — ACETAMINOPHEN 325 MG PO TABS: 650.0000 mg | ORAL_TABLET | Freq: Four times a day (QID) | ORAL | Status: DC | PRN

## 2017-07-17 MED ORDER — PROMETHAZINE HCL 25 MG/ML IJ SOLN
12.5000 mg | Freq: Four times a day (QID) | INTRAMUSCULAR | Status: DC | PRN
  Administered 2017-07-17: 12.5 mg via INTRAVENOUS
  Filled 2017-07-17: qty 1

## 2017-07-17 MED ORDER — ONDANSETRON HCL 4 MG/2ML IJ SOLN
4.0000 mg | Freq: Four times a day (QID) | INTRAMUSCULAR | Status: DC | PRN
  Administered 2017-07-18: 09:00:00 4 mg via INTRAVENOUS
  Filled 2017-07-17: qty 2

## 2017-07-17 MED ORDER — CIPROFLOXACIN IN D5W 400 MG/200ML IV SOLN
400.0000 mg | Freq: Once | INTRAVENOUS | Status: AC
  Administered 2017-07-17: 400 mg via INTRAVENOUS
  Filled 2017-07-17: qty 200

## 2017-07-17 MED ORDER — SODIUM CHLORIDE 0.9 % IV BOLUS (SEPSIS)
1000.0000 mL | Freq: Once | INTRAVENOUS | Status: AC
  Administered 2017-07-17: 1000 mL via INTRAVENOUS

## 2017-07-17 MED ORDER — HYDROMORPHONE HCL 1 MG/ML IJ SOLN
0.2500 mg | INTRAMUSCULAR | Status: DC | PRN
  Administered 2017-07-17: 0.25 mg via INTRAVENOUS
  Filled 2017-07-17: qty 1

## 2017-07-17 NOTE — ED Triage Notes (Signed)
Pt from Dr. Raylene Miyamoto office. She was here on Saturday with a diverticulitis flare and then saw her pcp. She states that she was put on antibiotics on Saturday and has continued to feel poorly. Pt states that she has n/v and can't keep food down. Pt reports continuing lower abdominal pain, as well as epigastric pain. NAD noted.

## 2017-07-17 NOTE — ED Provider Notes (Signed)
[facility] Emergency Department Provider Note    First MD Initiated Contact with Patient 07/17/17 1634     (approximate)  I have reviewed the triage vital signs and the nursing notes.   HISTORY  Chief Complaint Emesis; Nausea; and Abdominal Pain    HPI Laura Johns is a 66 y.o. female presents with persistent vomiting and diffuse abdominal pain. Patient was seen on Saturday for similar symptoms with recent diagnosis of diverticulitis. CT imaging at that point showed no abscess or worsening diverticulitis. Blood work had improved and patient wasn't in no acute distress. She was given antiemetics and pain control and she was concerned that she could tolerate oral hydration she was discharged home for outpatient follow-up. In the interim the patient states that her symptoms had worsened and went back to see her primary care physician twice but did not come back to the ER. She states that she's not been keeping down her antibiotics and is having worsening nausea and vomiting. No fevers or chills. No diarrhea. No change in the character of the pain.   Past Medical History:  Diagnosis Date  . Diverticulitis   . Hyperlipemia   . Hypertension    Family History  Problem Relation Age of Onset  . Stroke Mother   . Heart attack Father   . Stroke Father   . Breast cancer Neg Hx    Past Surgical History:  Procedure Laterality Date  . ABDOMINAL HYSTERECTOMY    . CATARACT EXTRACTION W/PHACO Right 08/25/2014   Procedure: CATARACT EXTRACTION PHACO AND INTRAOCULAR LENS PLACEMENT (IOC);  Surgeon: Tonny Branch, MD;  Location: [facility];  Service: Ophthalmology;  Laterality: Right;  CDE 5.82   Patient Active Problem List   Diagnosis Date Noted  . Diverticulitis 07/17/2017  . HLD (hyperlipidemia) 07/17/2017  . HTN (hypertension) 07/17/2017      Prior to Admission medications   Medication Sig Start Date End Date Taking? Authorizing Provider  aspirin EC 81 MG tablet  Take 81 mg by mouth daily.   Yes [provider]  Calcium Carb-Cholecalciferol (CALCIUM 600+D3) 600-800 MG-UNIT TABS Take 1 tablet by mouth daily.    Yes [provider]  ciprofloxacin (CIPRO) 500 MG tablet Take 500 mg by mouth 2 (two) times daily. 07/10/17 07/19/17 Yes [provider]  hydrochlorothiazide (HYDRODIURIL) 25 MG tablet Take 25 mg by mouth daily.   Yes [provider]  lisinopril (PRINIVIL,ZESTRIL) 10 MG tablet Take 10 mg by mouth daily.   Yes [provider]  metroNIDAZOLE (FLAGYL) 500 MG tablet Take 500 mg by mouth 3 (three) times daily. 07/10/17 07/19/17 Yes [provider]  ondansetron (ZOFRAN ODT) 4 MG disintegrating tablet Take 1 tablet (4 mg total) by mouth every 8 (eight) hours as needed for nausea or vomiting. 07/12/17  Yes Merlyn Lot, MD  traMADol (ULTRAM) 50 MG tablet Take 1 tablet (50 mg total) by mouth every 6 (six) hours as needed. 07/12/17 07/12/18 Yes Merlyn Lot, MD    Allergies Contrast media [iodinated diagnostic agents]; Morphine and related; and Nexium [esomeprazole magnesium]    Social History Social History  Substance Use Topics  . Smoking status: Never Smoker  . Smokeless tobacco: Never Used  . Alcohol use No    Review of Systems Patient denies headaches, rhinorrhea, blurry vision, numbness, shortness of breath, chest pain, edema, cough, abdominal pain, nausea, vomiting, diarrhea, dysuria, fevers, rashes or hallucinations unless otherwise stated above in HPI. ____________________________________________   PHYSICAL EXAM:  VITAL SIGNS:  Vitals:   07/17/17 1609 07/17/17 1833  BP: 133/77 (!) 148/89  Pulse: 73 72  Resp: 18 16  Temp: 98.9 F (37.2 C)     Constitutional: Alert and oriented. Ill appearing but in no acute distress. Eyes: Conjunctivae are normal.  Head: Atraumatic. Nose: No congestion/rhinnorhea. Mouth/Throat: Mucous membranes are moist.   Neck: No stridor. Painless ROM.   Cardiovascular: Normal rate, regular rhythm. Grossly normal heart sounds.  Good peripheral circulation. Respiratory: Normal respiratory effort.  No retractions. Lungs CTAB. Gastrointestinal: Soft with diffuse ttp but no guarding or rebound.  No RLQ guarding. No distention. No abdominal bruits. No CVA tenderness. Musculoskeletal: No lower extremity tenderness nor edema.  No joint effusions. Neurologic:  Normal speech and language. No gross focal neurologic deficits are appreciated. No facial droop Skin:  Skin is warm, dry and intact. No rash noted. Psychiatric: Mood and affect are normal. Speech and behavior are normal. ____________________________________________   LABS (all labs ordered are listed, but only abnormal results are displayed)  Results for orders placed or performed during the hospital encounter of 07/17/17 (from the past 24 hour(s))  Lipase, blood     Status: None   Collection Time: 07/17/17  4:21 PM  Result Value Ref Range   Lipase 17 11 - 51 U/L  Comprehensive metabolic panel     Status: Abnormal   Collection Time: 07/17/17  4:21 PM  Result Value Ref Range   Sodium 136 135 - 145 mmol/L   Potassium 3.8 3.5 - 5.1 mmol/L   Chloride 95 (L) 101 - 111 mmol/L   CO2 27 22 - 32 mmol/L   Glucose, Bld 90 65 - 99 mg/dL   BUN 12 6 - 20 mg/dL   Creatinine, Ser 0.94 0.44 - 1.00 mg/dL   Calcium 10.9 (H) 8.9 - 10.3 mg/dL   Total Protein 7.5 6.5 - 8.1 g/dL   Albumin 3.8 3.5 - 5.0 g/dL   AST 32 15 - 41 U/L   ALT 19 14 - 54 U/L   Alkaline Phosphatase 46 38 - 126 U/L   Total Bilirubin 0.7 0.3 - 1.2 mg/dL   GFR calc non Af Amer >60 >60 mL/min   GFR calc Af Amer >60 >60 mL/min   Anion gap 14 5 - 15  CBC     Status: Abnormal   Collection Time: 07/17/17  4:21 PM  Result Value Ref Range   WBC 5.6 3.6 - 11.0 K/uL   RBC 5.33 (H) 3.80 - 5.20 MIL/uL   Hemoglobin 13.4 12.0 - 16.0 g/dL   HCT 40.7 35.0 - 47.0 %   MCV 76.4 (L) 80.0 - 100.0 fL   MCH 25.2 (L) 26.0 - 34.0 pg   MCHC 33.0  32.0 - 36.0 g/dL   RDW 13.8 11.5 - 14.5 %   Platelets 340 150 - 440 K/uL   ____________________________________________  EKG My review and personal interpretation at Time: 16:19   Indication: abdominal pain  Rate: 65  Rhythm: sinus Axis: normal  Other: normal intervals, no stemi ____________________________________________   ____________________________________________   PROCEDURES  Procedure(s) performed:  Procedures    Critical Care performed: no ____________________________________________   INITIAL IMPRESSION / ASSESSMENT AND PLAN / ED COURSE  Pertinent labs & imaging results that were available during my care of the patient were reviewed by me and considered in my medical decision making (see chart for details).  DDX: diverticiultis, enteritis, dehydration, cholecystitis, cholelithiasis, uti  JAASIA VIGLIONE is a 66 y.o. who presents to the  ED with persistent abdominal pain nausea and vomiting. He is afebrile hemodynamically stable. Blood work is actually improved from previous. She does appear ill but not critically ill. I do suspect this is secondary to failure of outpatient therapy. Based on her abdominal exam I do not feel that repeat CT imaging is emergently indicated at this moment. Does not have any right upper quadrant pain and no biliary elevation to suggest cholelithiasis or cholecystitis.  Have discussed with the patient and available family all diagnostics and treatments performed thus far and all questions were answered to the best of my ability. The patient demonstrates understanding and agreement with plan.       ____________________________________________   FINAL CLINICAL IMPRESSION(S) / ED DIAGNOSES  Final diagnoses:  Diverticulitis  Non-intractable vomiting with nausea, unspecified vomiting type      NEW MEDICATIONS STARTED DURING THIS VISIT:  New Prescriptions   No medications on file     Note:  This document was prepared using Dragon  voice recognition software and may include unintentional dictation errors.    Merlyn Lot, MD 07/17/17 [phone]

## 2017-07-17 NOTE — Progress Notes (Signed)
Pharmacist - Prescriber Communication  Ciprofloxacin dose modified from 200 mg IV x 1 to 400 mg IV x 1 due to creatinine clearance greater than 30 mL/min and indication IAI/diverticulitis.   Margerite Impastato A. [place], [place].D., BCPS Clinical Pharmacist 07/17/2017 17:05

## 2017-07-17 NOTE — ED Notes (Signed)
Pt ambulated to bathroom with steady gait. 

## 2017-07-17 NOTE — Consult Note (Signed)
Pharmacy Antibiotic Note  Laura Johns is a 66 y.o. female admitted on 07/17/2017 with diverticulitis/IAI.  Pharmacy has been consulted for meropenem dosing. Patient was tx w/ cipro/flagyl op but her symptoms worsened and she was not able to keep them down  Plan: Meropenem 1g q 12 hours  Height: 5\' 1"  (154.9 cm) Weight: 127 lb (57.6 kg) IBW/kg (Calculated) : 47.8  Temp (24hrs), Avg:98.9 F (37.2 C), Min:98.9 F (37.2 C), Max:98.9 F (37.2 C)   Recent Labs Lab 07/12/17 1308 07/17/17 1621  WBC 8.7 5.6  CREATININE 0.97 0.94    Estimated Creatinine Clearance: 48 mL/min (by C-G formula based on SCr of 0.94 mg/dL).    Allergies  Allergen Reactions  . Contrast Media [Iodinated Diagnostic Agents]   . Morphine And Related Anxiety  . Nexium [Esomeprazole Magnesium] Rash    Antimicrobials this admission: cipro 7/19 >> one dose metronidazole 7/19 >> one dose Meropenem 7/19>>  Dose adjustments this admission:   Microbiology results: 7/19 BCx:    Thank you for allowing pharmacy to be a part of this patient's care.  Ramond Dial, Pharm.D, BCPS Clinical Pharmacist  07/17/2017 8:44 PM

## 2017-07-17 NOTE — H&P (Signed)
[facility] at [place] NAME: Laura Johns    MR#:  [id number]  DATE OF BIRTH:  [birth year]  DATE OF ADMISSION:  07/17/2017  PRIMARY CARE PHYSICIAN: [name], MD   REQUESTING/REFERRING PHYSICIAN: Quentin Cornwall, MD  CHIEF COMPLAINT:   Chief Complaint  Patient presents with  . Emesis  . Nausea  . Abdominal Pain    HISTORY OF PRESENT ILLNESS:  Laura Johns  is a 66 y.o. female who presents with Abdominal pain, nausea or vomiting. Patient has been having these symptoms for greater than a week now. She went to her outpatient physician and had a CT scan was diagnosed with diverticulitis. She was placed on outpatient by mouth antibiotics 1 week ago. Her symptoms have not improved. Hospitalists were called for admission for IV antibiotics in the setting of failure of outpatient antibiotics to treat her diverticulitis.  PAST MEDICAL HISTORY:   Past Medical History:  Diagnosis Date  . Diverticulitis   . Hyperlipemia   . Hypertension     PAST SURGICAL HISTORY:   Past Surgical History:  Procedure Laterality Date  . ABDOMINAL HYSTERECTOMY    . CATARACT EXTRACTION W/PHACO Right 08/25/2014   Procedure: CATARACT EXTRACTION PHACO AND INTRAOCULAR LENS PLACEMENT (IOC);  Surgeon: Tonny Branch, MD;  Location: [facility];  Service: Ophthalmology;  Laterality: Right;  CDE 5.82    SOCIAL HISTORY:   Social History  Substance Use Topics  . Smoking status: Never Smoker  . Smokeless tobacco: Never Used  . Alcohol use No    FAMILY HISTORY:   Family History  Problem Relation Age of Onset  . Stroke Mother   . Heart attack Father   . Stroke Father   . Breast cancer Neg Hx     DRUG ALLERGIES:   Allergies  Allergen Reactions  . Contrast Media [Iodinated Diagnostic Agents]   . Morphine And Related Anxiety  . Nexium [Esomeprazole Magnesium] Rash    MEDICATIONS AT HOME:   Prior to Admission medications   Medication Sig Start Date End  Date Taking? Authorizing Provider  aspirin EC 81 MG tablet Take 81 mg by mouth daily.   Yes [provider]  Calcium Carb-Cholecalciferol (CALCIUM 600+D3) 600-800 MG-UNIT TABS Take 1 tablet by mouth daily.    Yes [provider]  ciprofloxacin (CIPRO) 500 MG tablet Take 500 mg by mouth 2 (two) times daily. 07/10/17 07/19/17 Yes [provider]  hydrochlorothiazide (HYDRODIURIL) 25 MG tablet Take 25 mg by mouth daily.   Yes [provider]  lisinopril (PRINIVIL,ZESTRIL) 10 MG tablet Take 10 mg by mouth daily.   Yes [provider]  metroNIDAZOLE (FLAGYL) 500 MG tablet Take 500 mg by mouth 3 (three) times daily. 07/10/17 07/19/17 Yes [provider]  ondansetron (ZOFRAN ODT) 4 MG disintegrating tablet Take 1 tablet (4 mg total) by mouth every 8 (eight) hours as needed for nausea or vomiting. 07/12/17  Yes Merlyn Lot, MD  traMADol (ULTRAM) 50 MG tablet Take 1 tablet (50 mg total) by mouth every 6 (six) hours as needed. 07/12/17 07/12/18 Yes Merlyn Lot, MD    REVIEW OF SYSTEMS:  Review of Systems  Constitutional: Negative for chills, fever, malaise/fatigue and weight loss.  HENT: Negative for ear pain, hearing loss and tinnitus.   Eyes: Negative for blurred vision, double vision, pain and redness.  Respiratory: Negative for cough, hemoptysis and shortness of breath.   Cardiovascular: Negative for chest pain, palpitations, orthopnea and leg swelling.  Gastrointestinal: Positive  for abdominal pain, nausea and vomiting. Negative for constipation and diarrhea.  Genitourinary: Negative for dysuria, frequency and hematuria.  Musculoskeletal: Negative for back pain, joint pain and neck pain.  Skin:       No acne, rash, or lesions  Neurological: Negative for dizziness, tremors, focal weakness and weakness.  Endo/Heme/Allergies: Negative for polydipsia. Does not bruise/bleed easily.  Psychiatric/Behavioral: Negative for depression. The patient  is not nervous/anxious and does not have insomnia.      VITAL SIGNS:   Vitals:   07/17/17 1609  BP: 133/77  Pulse: 73  Resp: 18  Temp: 98.9 F (37.2 C)  TempSrc: Oral  SpO2: 100%  Weight: 57.6 kg (127 lb)  Height: 5\' 1"  (1.549 m)   Wt Readings from Last 3 Encounters:  07/17/17 57.6 kg (127 lb)  08/19/14 61.2 kg (135 lb)    PHYSICAL EXAMINATION:  Physical Exam  Vitals reviewed. Constitutional: She is oriented to person, place, and time. She appears well-developed and well-nourished. No distress.  HENT:  Head: Normocephalic and atraumatic.  Mouth/Throat: Oropharynx is clear and moist.  Eyes: Pupils are equal, round, and reactive to light. Conjunctivae and EOM are normal. No scleral icterus.  Neck: Normal range of motion. Neck supple. No JVD present. No thyromegaly present.  Cardiovascular: Normal rate, regular rhythm and intact distal pulses.  Exam reveals no gallop and no friction rub.   No murmur heard. Respiratory: Effort normal and breath sounds normal. No respiratory distress. She has no wheezes. She has no rales.  GI: Soft. Bowel sounds are normal. She exhibits no distension. There is tenderness.  Musculoskeletal: Normal range of motion. She exhibits no edema.  No arthritis, no gout  Lymphadenopathy:    She has no cervical adenopathy.  Neurological: She is alert and oriented to person, place, and time. No cranial nerve deficit.  No dysarthria, no aphasia  Skin: Skin is warm and dry. No rash noted. No erythema.  Psychiatric: She has a normal mood and affect. Her behavior is normal. Judgment and thought content normal.    LABORATORY PANEL:   CBC  Recent Labs Lab 07/17/17 1621  WBC 5.6  HGB 13.4  HCT 40.7  PLT 340   ------------------------------------------------------------------------------------------------------------------  Chemistries   Recent Labs Lab 07/17/17 1621  NA 136  K 3.8  CL 95*  CO2 27  GLUCOSE 90  BUN 12  CREATININE 0.94   CALCIUM 10.9*  AST 32  ALT 19  ALKPHOS 46  BILITOT 0.7   ------------------------------------------------------------------------------------------------------------------  Cardiac Enzymes No results for input(s): TROPONINI in the last 168 hours. ------------------------------------------------------------------------------------------------------------------  RADIOLOGY:  No results found.  EKG:   Orders placed or performed during the hospital encounter of 07/17/17  . EKG 12-Lead  . EKG 12-Lead    IMPRESSION AND PLAN:  Principal Problem:   Diverticulitis - patient failed outpatient by mouth Cipro and Flagyl. We'll admit her for IV meropenem Active Problems:   HTN (hypertension) - continue home meds   HLD (hyperlipidemia) - continue home meds  All the records are reviewed and case discussed with ED provider. Management plans discussed with the patient and/or family.  DVT PROPHYLAXIS: SubQ lovenox  GI PROPHYLAXIS: None  ADMISSION STATUS: Observation  CODE STATUS: Full Code Status History    This patient does not have a recorded code status. Please follow your organizational policy for patients in this situation.      TOTAL TIME TAKING CARE OF THIS PATIENT: 40 minutes.   Coree Riester FIELDING 07/17/2017, 5:59 PM  [place]  [phone]  CC: Primary care physician; [name], MD  Note:  This document was prepared using Dragon voice recognition software and may include unintentional dictation errors.

## 2017-07-18 DIAGNOSIS — E785 Hyperlipidemia, unspecified: Secondary | ICD-10-CM | POA: Diagnosis present

## 2017-07-18 DIAGNOSIS — Z90710 Acquired absence of both cervix and uterus: Secondary | ICD-10-CM | POA: Diagnosis not present

## 2017-07-18 DIAGNOSIS — Z7982 Long term (current) use of aspirin: Secondary | ICD-10-CM | POA: Diagnosis not present

## 2017-07-18 DIAGNOSIS — K5732 Diverticulitis of large intestine without perforation or abscess without bleeding: Secondary | ICD-10-CM | POA: Diagnosis present

## 2017-07-18 DIAGNOSIS — Z9841 Cataract extraction status, right eye: Secondary | ICD-10-CM | POA: Diagnosis not present

## 2017-07-18 DIAGNOSIS — I10 Essential (primary) hypertension: Secondary | ICD-10-CM | POA: Diagnosis present

## 2017-07-18 DIAGNOSIS — Z886 Allergy status to analgesic agent status: Secondary | ICD-10-CM | POA: Diagnosis not present

## 2017-07-18 DIAGNOSIS — Z823 Family history of stroke: Secondary | ICD-10-CM | POA: Diagnosis not present

## 2017-07-18 DIAGNOSIS — Z885 Allergy status to narcotic agent status: Secondary | ICD-10-CM | POA: Diagnosis not present

## 2017-07-18 DIAGNOSIS — Z961 Presence of intraocular lens: Secondary | ICD-10-CM | POA: Diagnosis present

## 2017-07-18 DIAGNOSIS — Z91041 Radiographic dye allergy status: Secondary | ICD-10-CM | POA: Diagnosis not present

## 2017-07-18 DIAGNOSIS — N200 Calculus of kidney: Secondary | ICD-10-CM | POA: Diagnosis present

## 2017-07-18 DIAGNOSIS — K5792 Diverticulitis of intestine, part unspecified, without perforation or abscess without bleeding: Secondary | ICD-10-CM | POA: Diagnosis present

## 2017-07-18 DIAGNOSIS — Z8249 Family history of ischemic heart disease and other diseases of the circulatory system: Secondary | ICD-10-CM | POA: Diagnosis not present

## 2017-07-18 LAB — BASIC METABOLIC PANEL
ANION GAP: 6 (ref 5–15)
BUN: 11 mg/dL (ref 6–20)
CHLORIDE: 107 mmol/L (ref 101–111)
CO2: 27 mmol/L (ref 22–32)
CREATININE: 1.07 mg/dL — AB (ref 0.44–1.00)
Calcium: 9.3 mg/dL (ref 8.9–10.3)
GFR calc non Af Amer: 53 mL/min — ABNORMAL LOW (ref >60)
Glucose, Bld: 77 mg/dL (ref 65–99)
POTASSIUM: 3.8 mmol/L (ref 3.5–5.1)
SODIUM: 140 mmol/L (ref 135–145)

## 2017-07-18 LAB — CBC
HCT: 35.7 % (ref 35.0–47.0)
Hemoglobin: 11.8 g/dL — ABNORMAL LOW (ref 12.0–16.0)
MCH: 25.7 pg — ABNORMAL LOW (ref 26.0–34.0)
MCHC: 33 g/dL (ref 32.0–36.0)
MCV: 77.8 fL — AB (ref 80.0–100.0)
PLATELETS: 301 10*3/uL (ref 150–440)
RBC: 4.59 MIL/uL (ref 3.80–5.20)
RDW: 14 % (ref 11.5–14.5)
WBC: 4.2 10*3/uL (ref 3.6–11.0)

## 2017-07-18 NOTE — Progress Notes (Signed)
Fall and allergy wristbands placed to patient's left wrist.

## 2017-07-18 NOTE — Progress Notes (Signed)
[facility] at [place] NAME: Laura Johns    MR#:  [id number]  DATE OF BIRTH:  28-Jul-1951  SUBJECTIVE: admitted  for emesis, nausea, abdominal pain, failed outpatient therapy for diverticulitis. Still has slight abdominal pain in the left lower quadrant but no nausea.   CHIEF COMPLAINT:   Chief Complaint  Patient presents with  . Emesis  . Nausea  . Abdominal Pain    REVIEW OF SYSTEMS:    Review of Systems  Constitutional: Negative for chills and fever.  HENT: Negative for hearing loss.   Eyes: Negative for blurred vision, double vision and photophobia.  Respiratory: Negative for cough, hemoptysis and shortness of breath.   Cardiovascular: Negative for palpitations, orthopnea and leg swelling.  Gastrointestinal: Positive for abdominal pain. Negative for diarrhea and vomiting.  Genitourinary: Negative for dysuria and urgency.  Musculoskeletal: Negative for myalgias and neck pain.  Skin: Negative for rash.  Neurological: Negative for dizziness, focal weakness, seizures, weakness and headaches.  Psychiatric/Behavioral: Negative for memory loss. The patient does not have insomnia.     Nutrition:  Tolerating Diet: Tolerating PT:      DRUG ALLERGIES:   Allergies  Allergen Reactions  . Contrast Media [Iodinated Diagnostic Agents]   . Morphine And Related Anxiety  . Nexium [Esomeprazole Magnesium] Rash    VITALS:  Blood pressure 108/70, pulse 60, temperature 98.7 F (37.1 C), resp. rate 20, height 5\' 1"  (1.549 m), weight 58.1 kg (128 lb 1.6 oz), SpO2 100 %.  PHYSICAL EXAMINATION:   Physical Exam  GENERAL:  66 y.o.-year-old patient lying in the bed with no acute distress.  EYES: Pupils equal, round, reactive to light and accommodation. No scleral icterus. Extraocular muscles intact.  HEENT: Head atraumatic, normocephalic. Oropharynx and nasopharynx clear.  NECK:  Supple, no jugular venous distention. No thyroid  enlargement, no tenderness.  LUNGS: Normal breath sounds bilaterally, no wheezing, rales,rhonchi or crepitation. No use of accessory muscles of respiration.  CARDIOVASCULAR: S1, S2 normal. No murmurs, rubs, or gallops.  ABDOMEN: Soft, Tenderness present in the left lower quadrant, no rebound tenderness.. Bowel sounds present. No organomegaly or mass.  EXTREMITIES: No pedal edema, cyanosis, or clubbing.  NEUROLOGIC: Cranial nerves II through XII are intact. Muscle strength 5/5 in all extremities. Sensation intact. Gait not checked.  PSYCHIATRIC: The patient is alert and oriented x 3.  SKIN: No obvious rash, lesion, or ulcer.    LABORATORY PANEL:   CBC  Recent Labs Lab 07/18/17 0427  WBC 4.2  HGB 11.8*  HCT 35.7  PLT 301   ------------------------------------------------------------------------------------------------------------------  Chemistries   Recent Labs Lab 07/17/17 1621 07/18/17 0427  NA 136 140  K 3.8 3.8  CL 95* 107  CO2 27 27  GLUCOSE 90 77  BUN 12 11  CREATININE 0.94 1.07*  CALCIUM 10.9* 9.3  AST 32  --   ALT 19  --   ALKPHOS 46  --   BILITOT 0.7  --    ------------------------------------------------------------------------------------------------------------------  Cardiac Enzymes No results for input(s): TROPONINI in the last 168 hours. ------------------------------------------------------------------------------------------------------------------  RADIOLOGY:  No results found.   ASSESSMENT AND PLAN:   Principal Problem:   Diverticulitis Active Problems:   HLD (hyperlipidemia)   HTN (hypertension)   #1. sigmoid colon diverticulitis, failed outpatient therapy. On meropenem right now. Follow closely, continue IV fluids, started on regular diet if patient has trouble with regular diet with recurrent nausea and abdominal pain change back to full liquids. Patient's CBC and  BMP are normal. Discussed  this patient and registered nurse. Continue  gentle hydration. Continue morphine for pain control. 2 #2 essential hypertension: Controlled. #3 hyperlipidemia   All the records are reviewed and case discussed with Care Management/Social Workerr. Management plans discussed with the patient, family and they are in agreement.  CODE STATUS: Full code  TOTAL TIME TAKING CARE OF THIS PATIENT: 35 minutes.   POSSIBLE D/C IN 1-2 DAYS, DEPENDING ON CLINICAL CONDITION.   Epifanio Lesches M.D on 07/18/2017 at 9:33 AM  Between 7am to 6pm - Pager - [phone]  After 6pm go to [email or web address] - password [email or web address]  Office  [phone]  CC: Primary care physician; [name], MD

## 2017-07-18 NOTE — Consult Note (Signed)
Pharmacy Antibiotic Note  Laura Johns is a 66 y.o. female admitted on 07/17/2017 with diverticulitis/IAI.  Pharmacy has been consulted for meropenem dosing. Patient was tx w/ cipro/flagyl op but her symptoms worsened and she was not able to keep them down  Plan: Continue meropenem 1gm IV Q12H  Patient reports nausea/vomiting as outpatient related to oral metronidazole. Discussed with MD, will plan to transition to augmentin prior to discharge if patient continues to improve.   Height: 5\' 1"  (154.9 cm) Weight: 128 lb 1.6 oz (58.1 kg) IBW/kg (Calculated) : 47.8  Temp (24hrs), Avg:98.8 F (37.1 C), Min:98.7 F (37.1 C), Max:98.9 F (37.2 C)   Recent Labs Lab 07/12/17 1308 07/17/17 1621 07/18/17 0427  WBC 8.7 5.6 4.2  CREATININE 0.97 0.94 1.07*    Estimated Creatinine Clearance: 42.4 mL/min (A) (by C-G formula based on SCr of 1.07 mg/dL (H)).    Allergies  Allergen Reactions  . Contrast Media [Iodinated Diagnostic Agents]   . Morphine And Related Anxiety  . Nexium [Esomeprazole Magnesium] Rash    Antimicrobials this admission: cipro 7/19 >> one dose metronidazole 7/19 >> one dose Meropenem 7/19>>  Dose adjustments this admission:   Microbiology results: 7/19 BCx:    Thank you for allowing pharmacy to be a part of this patient's care.  Rexene Edison, PharmD, BCPS Clinical Pharmacist  07/18/2017 2:13 PM

## 2017-07-19 MED ORDER — AMOXICILLIN-POT CLAVULANATE 875-125 MG PO TABS: 1.0000 | ORAL_TABLET | Freq: Two times a day (BID) | ORAL | 0 refills | Status: AC

## 2017-07-19 MED ORDER — AMOXICILLIN-POT CLAVULANATE 875-125 MG PO TABS
1.0000 | ORAL_TABLET | Freq: Two times a day (BID) | ORAL | Status: DC
  Administered 2017-07-19: 1 via ORAL
  Filled 2017-07-19: qty 1

## 2017-07-19 NOTE — Progress Notes (Signed)
Pt has yellow arm band on and brown socks in room per moderate fall risk protocol. Pt also wearing red allergy bracelet. Ammie Dalton, RN

## 2017-07-19 NOTE — Progress Notes (Signed)
Discharge instructions along with home medications and follow up gone over with patient and husband. Both verbalize that they understood instructions. No prescriptions given to patient. IV removed. Pt being discharged home on room air, no distress noted. Tressy Kunzman S Fenton, RN 

## 2017-07-19 NOTE — Progress Notes (Signed)
[facility] [place] NAME: Laura Johns    MR#:  [id number]  DATE OF BIRTH:  [birth year]  SUBJECTIVE: Says no further abdominal pain or nausea. Tolerating the diet. Patient will be started on Augmentin by mouth today and stop IV meropenem, if she tolerates Augmentin without nausea or vomiting she'll be discharged home later in  afternoon. Today.  CHIEF COMPLAINT:   Chief Complaint  Patient presents with  . Emesis  . Nausea  . Abdominal Pain    REVIEW OF SYSTEMS:    Review of Systems  Constitutional: Negative for chills and fever.  HENT: Negative for hearing loss.   Eyes: Negative for blurred vision, double vision and photophobia.  Respiratory: Negative for cough, hemoptysis and shortness of breath.   Cardiovascular: Negative for palpitations, orthopnea and leg swelling.  Gastrointestinal: Negative for abdominal pain, diarrhea and vomiting.  Genitourinary: Negative for dysuria and urgency.  Musculoskeletal: Negative for myalgias and neck pain.  Skin: Negative for rash.  Neurological: Negative for dizziness, focal weakness, seizures, weakness and headaches.  Psychiatric/Behavioral: Negative for memory loss. The patient does not have insomnia.     Nutrition:  Tolerating Diet: Tolerating PT:      DRUG ALLERGIES:   Allergies  Allergen Reactions  . Contrast Media [Iodinated Diagnostic Agents]   . Morphine And Related Anxiety  . Nexium [Esomeprazole Magnesium] Rash    VITALS:  Blood pressure 132/76, pulse 67, temperature 98.4 F (36.9 C), temperature source Oral, resp. rate 18, height 5\' 1"  (1.549 m), weight 58.1 kg (128 lb 1.6 oz), SpO2 99 %.  PHYSICAL EXAMINATION:   Physical Exam  GENERAL:  66 y.o.-year-old patient lying in the bed with no acute distress.  EYES: Pupils equal, round, reactive to light and accommodation. No scleral icterus. Extraocular muscles intact.  HEENT: Head atraumatic, normocephalic. Oropharynx  and nasopharynx clear.  NECK:  Supple, no jugular venous distention. No thyroid enlargement, no tenderness.  LUNGS: Normal breath sounds bilaterally, no wheezing, rales,rhonchi or crepitation. No use of accessory muscles of respiration.  CARDIOVASCULAR: S1, S2 normal. No murmurs, rubs, or gallops.  ABDOMEN: Soft, nontender, nondistended, bowel sounds present. EXTREMITIES: No pedal edema, cyanosis, or clubbing.  NEUROLOGIC: Cranial nerves II through XII are intact. Muscle strength 5/5 in all extremities. Sensation intact. Gait not checked.  PSYCHIATRIC: The patient is alert and oriented x 3.  SKIN: No obvious rash, lesion, or ulcer.    LABORATORY PANEL:   CBC  Recent Labs Lab 07/18/17 0427  WBC 4.2  HGB 11.8*  HCT 35.7  PLT 301   ------------------------------------------------------------------------------------------------------------------  Chemistries   Recent Labs Lab 07/17/17 1621 07/18/17 0427  NA 136 140  K 3.8 3.8  CL 95* 107  CO2 27 27  GLUCOSE 90 77  BUN 12 11  CREATININE 0.94 1.07*  CALCIUM 10.9* 9.3  AST 32  --   ALT 19  --   ALKPHOS 46  --   BILITOT 0.7  --    ------------------------------------------------------------------------------------------------------------------  Cardiac Enzymes No results for input(s): TROPONINI in the last 168 hours. ------------------------------------------------------------------------------------------------------------------  RADIOLOGY:  No results found.   ASSESSMENT AND PLAN:   Principal Problem:   Diverticulitis Active Problems:   HLD (hyperlipidemia)   HTN (hypertension)   #1. sigmoid colon diverticulitis, failed outpatient therapy.Started the Augmentin, DC IV meropenem, and she tolerates Augmentin without nausea or vomiting she'll be discharged home today. Discussed this with patient's nurse., Patient is agreeable for this. 2 #2 essential hypertension:  Controlled. #3 hyperlipidemia   All the  records are reviewed and case discussed with Care Management/Social Workerr. Management plans discussed with the patient, family and they are in agreement.  CODE STATUS: Full code  TOTAL TIME TAKING CARE OF THIS PATIENT: 35 minutes.   POSSIBLE D/C  today, DEPENDING ON CLINICAL CONDITION.   Epifanio Lesches M.D on 07/19/2017 at 7:26 AM  Between 7am to 6pm - Pager - [phone]  After 6pm go to [email or web address] - password [email or web address] [facility]  Office  [phone]  CC: Primary care physician; [name], MD

## 2017-07-22 LAB — CULTURE, BLOOD (ROUTINE X 2)
CULTURE: NO GROWTH
CULTURE: NO GROWTH

## 2017-07-22 NOTE — Discharge Summary (Signed)
Laura Johns, is a 66 y.o. female  DOB 1951-11-16  MRN [id number].  Admission date:  07/17/2017  Admitting Physician  Lance Coon, MD  Discharge Date:  07/19/2017   Primary MD  [name], MD  Recommendations for primary care physician for things to follow:   Follow-up with PCP in 1 week   Admission Diagnosis  Diverticulitis [K57.92] Non-intractable vomiting with nausea, unspecified vomiting type [R11.2]   Discharge Diagnosis  Diverticulitis [K57.92] Non-intractable vomiting with nausea, unspecified vomiting type [R11.2]    Principal Problem:   Diverticulitis Active Problems:   HLD (hyperlipidemia)   HTN (hypertension)      Past Medical History:  Diagnosis Date  . Diverticulitis   . Hyperlipemia   . Hypertension     Past Surgical History:  Procedure Laterality Date  . ABDOMINAL HYSTERECTOMY    . CATARACT EXTRACTION W/PHACO Right 08/25/2014   Procedure: CATARACT EXTRACTION PHACO AND INTRAOCULAR LENS PLACEMENT (IOC);  Surgeon: Tonny Branch, MD;  Location: [facility];  Service: Ophthalmology;  Laterality: Right;  CDE 5.82       History of present illness and  Hospital Course:     Kindly see H&P for history of present illness and admission details, please review complete Labs, Consult reports and Test reports for all details in brief  HPI  from the history and physical done on the day of admission Admitted for abdominal pain, nausea, vomiting, failed outpatient therapy for diverticulitis. She was given Cipro, Flagyl for diverticulitis by pcp..but she continued to have abdominal pain, nausea, vomiting. So she is admitted for diverticulitis with an outpatient therapy.   Hospital Course   1 sigmoid colon diverticulitis, failed outpatient therapy. Patient received meropenem in the hospital along with IV  fluids. Following the patient had no further nausea or vomiting, tolerated the diet. Discharge home with Augmentin for 7 days. #2. essential hypertension: Controlled #3 .hyperlipidemia: Continue statins.   Discharge Condition: stable   Follow UP      Discharge Instructions  and  Discharge Medications      Allergies as of 07/19/2017      Reactions   Contrast Media [iodinated Diagnostic Agents]    Morphine And Related Anxiety   Nexium [esomeprazole Magnesium] Rash      Medication List    STOP taking these medications   ciprofloxacin 500 MG tablet Commonly known as:  CIPRO   metroNIDAZOLE 500 MG tablet Commonly known as:  FLAGYL     TAKE these medications   amoxicillin-clavulanate 875-125 MG tablet Commonly known as:  AUGMENTIN Take 1 tablet by mouth 2 (two) times daily.   aspirin EC 81 MG tablet Take 81 mg by mouth daily.   CALCIUM 600+D3 600-800 MG-UNIT Tabs Generic drug:  Calcium Carb-Cholecalciferol Take 1 tablet by mouth daily.   hydrochlorothiazide 25 MG tablet Commonly known as:  HYDRODIURIL Take 25 mg by mouth daily.   lisinopril 10 MG tablet Commonly known as:  PRINIVIL,ZESTRIL Take 10 mg by mouth daily.   ondansetron 4 MG disintegrating tablet Commonly known as:  ZOFRAN ODT Take 1 tablet (4 mg total) by mouth every 8 (eight) hours as needed for nausea or vomiting.   traMADol 50 MG tablet Commonly known as:  ULTRAM Take 1 tablet (50 mg total) by mouth every 6 (six) hours as needed.         Diet and Activity recommendation: See Discharge Instructions above   Consults obtained - none   Major procedures and Radiology Reports - PLEASE review  detailed and final reports for all details, in brief -     Ct Abdomen Pelvis Wo Contrast  Result Date: 07/12/2017 CLINICAL DATA:  Abdominal pain. Recent diagnosis of diverticulitis. Concern for abscess or perforation. EXAM: CT ABDOMEN AND PELVIS WITHOUT CONTRAST TECHNIQUE: Multidetector CT imaging  of the abdomen and pelvis was performed following the standard protocol without IV contrast. COMPARISON:  Contrast-enhanced CT yesterday. FINDINGS: Lower chest: The lung bases are clear. No pleural fluid or consolidation. Hepatobiliary: Focal fatty infiltration adjacent with falciform ligament. High-density material in the gallbladder was not seen yesterday, likely vicarious excretion of contrast. No gallbladder inflammation. Pancreas: No ductal dilatation or inflammation. Spleen: Normal in size without focal abnormality. Adrenals/Urinary Tract: Normal adrenal glands. Punctate nonobstructing stone in the right kidney. No hydronephrosis. Parapelvic cysts in the left kidney, better assessed on prior contrast-enhanced exam. No hydronephrosis or perinephric edema. Urinary bladder is physiologically distended. No bladder wall thickening. Stomach/Bowel: Colonic wall thickening and pericolonic fat stranding involving the sigmoid colon in the left lower quadrant, unchanged from yesterday. No evidence of perforation or abscess. Multiple additional noninflamed diverticulum in the descending and sigmoid colon. There is no bowel obstruction. Stomach is decompressed. Normal appendix. Vascular/Lymphatic: Aortic atherosclerosis. No aneurysm. No abdominal or pelvic adenopathy, lack of IV contrast limits assessment on the current exam. Reproductive: Status post hysterectomy. No adnexal masses. Other: No ascites.  No development of free air or abscess. Musculoskeletal: There are no acute or suspicious osseous abnormalities. IMPRESSION: 1. Sigmoid colonic diverticulitis without interval perforation or abscess. 2. Punctate nonobstructing right renal stone. 3. Aortic atherosclerosis. Electronically Signed   By: Jeb Levering M.D.   On: 07/12/2017 19:42   Ct Abdomen Pelvis W Contrast  Result Date: 07/11/2017 CLINICAL DATA:  Nausea/vomiting, lower abdominal cramping/pain x4 days, leukocytosis EXAM: CT ABDOMEN AND PELVIS WITH  CONTRAST TECHNIQUE: Multidetector CT imaging of the abdomen and pelvis was performed using the standard protocol following bolus administration of intravenous contrast. CONTRAST:  100 mL Isovue 300 IV COMPARISON:  None. FINDINGS: Lower chest: Lung bases clear. Hepatobiliary: Liver is notable for focal fat/altered perfusion along the falciform ligament. Gallbladder is unremarkable. No intrahepatic or extrahepatic duct dilatation. Pancreas: Within normal limits. Spleen: Within normal limits. Adrenals/Urinary Tract: Adrenal glands are within normal limits. Right kidney is within normal limits. Left kidney is notable for renal sinus cysts. No renal calculi or hydronephrosis. Bladder is within normal limits. Stomach/Bowel: Stomach is within normal limits. No evidence of bowel obstruction. Normal appendix (series 2/image 50). Sigmoid diverticulosis with pericolonic inflammatory changes in the left lower quadrant (series 2/image 61), reflecting sigmoid diverticulitis. No drainable fluid collection/ abscess.  No free air. Vascular/Lymphatic: No evidence of abdominal aortic aneurysm. Atherosclerotic calcifications of the abdominal aorta and branch vessels. No suspicious abdominopelvic lymphadenopathy. Reproductive: Status post hysterectomy. Bilateral ovaries are within normal limits. Other: No abdominopelvic ascites. Musculoskeletal: Visualized osseous structures are within normal limits. IMPRESSION: Acute sigmoid diverticulitis. No drainable fluid collection/ abscess.  No free air. These results will be called to the ordering clinician or representative by the Radiology Department at the imaging location. Electronically Signed   By: Julian Hy M.D.   On: 07/11/2017 13:49   Mm Screening Breast Tomo Bilateral  Result Date: 07/07/2017 CLINICAL DATA:  Screening. EXAM: 2D DIGITAL SCREENING BILATERAL MAMMOGRAM WITH CAD AND ADJUNCT TOMO COMPARISON:  Previous exam(s). ACR Breast Density Category c: The breast tissue is  heterogeneously dense, which may obscure small masses. FINDINGS: There are no findings suspicious for malignancy. Images were processed  with CAD. IMPRESSION: No mammographic evidence of malignancy. A result letter of this screening mammogram will be mailed directly to the patient. RECOMMENDATION: Screening mammogram in one year. (Code:SM-B-01Y) BI-RADS CATEGORY  1: Negative. Electronically Signed   By: Fidela Salisbury M.D.   On: 07/07/2017 13:52    Micro Results     Recent Results (from the past 240 hour(s))  Blood culture (routine x 2)     Status: None   Collection Time: 07/17/17  5:19 PM  Result Value Ref Range Status   Specimen Description BLOOD RIGHT ANTECUBITAL  Final   Special Requests   Final    BOTTLES DRAWN AEROBIC AND ANAEROBIC Blood Culture results may not be optimal due to an excessive volume of blood received in culture bottles   Culture NO GROWTH 5 DAYS  Final   Report Status 07/22/2017 FINAL  Final  Blood culture (routine x 2)     Status: None   Collection Time: 07/17/17  5:19 PM  Result Value Ref Range Status   Specimen Description BLOOD BLOOD RIGHT HAND  Final   Special Requests   Final    BOTTLES DRAWN AEROBIC AND ANAEROBIC Blood Culture results may not be optimal due to an excessive volume of blood received in culture bottles   Culture NO GROWTH 5 DAYS  Final   Report Status 07/22/2017 FINAL  Final       Today   Subjective:   Laura Johns today has no headache,no chest abdominal pain,no new weakness tingling or numbness, feels much better wants to go home today.   Objective:   Blood pressure 114/72, pulse 69, temperature 98.8 F (37.1 C), temperature source Oral, resp. rate 18, height 5\' 1"  (1.549 m), weight 58.1 kg (128 lb 1.6 oz), SpO2 99 %.  No intake or output data in the 24 hours ending 07/22/17 1119  Exam Awake Alert, Oriented x 3, No new F.N deficits, Normal affect [place].AT,PERRAL Supple Neck,No JVD, No cervical lymphadenopathy appriciated.   Symmetrical Chest wall movement, Good air movement bilaterally, CTAB RRR,No Gallops,Rubs or new Murmurs, No Parasternal Heave +ve B.Sounds, Abd Soft, Non tender, No organomegaly appriciated, No rebound -guarding or rigidity. No Cyanosis, Clubbing or edema, No new Rash or bruise  Data Review   CBC w Diff:  Lab Results  Component Value Date   WBC 4.2 07/18/2017   HGB 11.8 (L) 07/18/2017   HCT 35.7 07/18/2017   PLT 301 07/18/2017    CMP:  Lab Results  Component Value Date   NA 140 07/18/2017   K 3.8 07/18/2017   CL 107 07/18/2017   CO2 27 07/18/2017   BUN 11 07/18/2017   CREATININE 1.07 (H) 07/18/2017   PROT 7.5 07/17/2017   ALBUMIN 3.8 07/17/2017   BILITOT 0.7 07/17/2017   ALKPHOS 46 07/17/2017   AST 32 07/17/2017   ALT 19 07/17/2017  .   Total Time in preparing paper work, data evaluation and todays exam - 47 minutes  Vila Dory M.D on 07/19/2017 at 11:19 AM    Note: This dictation was prepared with Dragon dictation along with smaller phrase technology. Any transcriptional errors that result from this process are unintentional.

## 2017-08-07 ENCOUNTER — Emergency Department (HOSPITAL_COMMUNITY): Payer: BLUE CROSS/BLUE SHIELD

## 2017-08-07 ENCOUNTER — Emergency Department (HOSPITAL_COMMUNITY)
Admission: EM | Admit: 2017-08-07 | Discharge: 2017-08-07 | Disposition: A | Discharge status: Home / Self Care | Payer: BLUE CROSS/BLUE SHIELD | Attending: Emergency Medicine | Admitting: Emergency Medicine

## 2017-08-07 ENCOUNTER — Encounter (HOSPITAL_COMMUNITY): Payer: Self-pay | Admitting: Emergency Medicine

## 2017-08-07 DIAGNOSIS — I10 Essential (primary) hypertension: Secondary | ICD-10-CM | POA: Insufficient documentation

## 2017-08-07 DIAGNOSIS — Z79899 Other long term (current) drug therapy: Secondary | ICD-10-CM | POA: Insufficient documentation

## 2017-08-07 DIAGNOSIS — Z7982 Long term (current) use of aspirin: Secondary | ICD-10-CM | POA: Insufficient documentation

## 2017-08-07 DIAGNOSIS — K5790 Diverticulosis of intestine, part unspecified, without perforation or abscess without bleeding: Secondary | ICD-10-CM | POA: Insufficient documentation

## 2017-08-07 DIAGNOSIS — K5792 Diverticulitis of intestine, part unspecified, without perforation or abscess without bleeding: Secondary | ICD-10-CM

## 2017-08-07 DIAGNOSIS — R109 Unspecified abdominal pain: Secondary | ICD-10-CM | POA: Diagnosis present

## 2017-08-07 LAB — URINALYSIS, ROUTINE W REFLEX MICROSCOPIC
BILIRUBIN URINE: NEGATIVE
GLUCOSE, UA: NEGATIVE mg/dL
Hgb urine dipstick: NEGATIVE
KETONES UR: 5 mg/dL — AB
Nitrite: NEGATIVE
PH: 6 (ref 5.0–8.0)
Protein, ur: NEGATIVE mg/dL
SPECIFIC GRAVITY, URINE: 1.023 (ref 1.005–1.030)

## 2017-08-07 LAB — COMPREHENSIVE METABOLIC PANEL
ALK PHOS: 36 U/L — AB (ref 38–126)
ALT: 19 U/L (ref 14–54)
AST: 36 U/L (ref 15–41)
Albumin: 3.6 g/dL (ref 3.5–5.0)
Anion gap: 8 (ref 5–15)
BILIRUBIN TOTAL: 0.6 mg/dL (ref 0.3–1.2)
BUN: 14 mg/dL (ref 6–20)
CALCIUM: 10.2 mg/dL (ref 8.9–10.3)
CO2: 28 mmol/L (ref 22–32)
CREATININE: 1.09 mg/dL — AB (ref 0.44–1.00)
Chloride: 102 mmol/L (ref 101–111)
GFR, EST AFRICAN AMERICAN: 60 mL/min — AB (ref >60)
GFR, EST NON AFRICAN AMERICAN: 52 mL/min — AB (ref >60)
Glucose, Bld: 90 mg/dL (ref 65–99)
Potassium: 3.3 mmol/L — ABNORMAL LOW (ref 3.5–5.1)
Sodium: 138 mmol/L (ref 135–145)
Total Protein: 6.4 g/dL — ABNORMAL LOW (ref 6.5–8.1)

## 2017-08-07 LAB — CBC
HCT: 37 % (ref 36.0–46.0)
Hemoglobin: 12.5 g/dL (ref 12.0–15.0)
MCH: 25.4 pg — ABNORMAL LOW (ref 26.0–34.0)
MCHC: 33.8 g/dL (ref 30.0–36.0)
MCV: 75.2 fL — ABNORMAL LOW (ref 78.0–100.0)
PLATELETS: 317 10*3/uL (ref 150–400)
RBC: 4.92 MIL/uL (ref 3.87–5.11)
RDW: 14.1 % (ref 11.5–15.5)
WBC: 5.3 10*3/uL (ref 4.0–10.5)

## 2017-08-07 LAB — LIPASE, BLOOD: Lipase: 24 U/L (ref 11–51)

## 2017-08-07 MED ORDER — GI COCKTAIL ~~LOC~~
30.0000 mL | Freq: Once | ORAL | Status: AC
  Administered 2017-08-07: 30 mL via ORAL
  Filled 2017-08-07: qty 30

## 2017-08-07 MED ORDER — TRAMADOL HCL 50 MG PO TABS: 50.0000 mg | ORAL_TABLET | Freq: Four times a day (QID) | ORAL | 0 refills | Status: AC | PRN

## 2017-08-07 MED ORDER — METRONIDAZOLE 500 MG PO TABS: 500.0000 mg | ORAL_TABLET | Freq: Two times a day (BID) | ORAL | 0 refills | Status: DC

## 2017-08-07 MED ORDER — ONDANSETRON 4 MG PO TBDP: 4.0000 mg | ORAL_TABLET | Freq: Three times a day (TID) | ORAL | 0 refills | Status: DC | PRN

## 2017-08-07 MED ORDER — CIPROFLOXACIN HCL 500 MG PO TABS: 500.0000 mg | ORAL_TABLET | Freq: Two times a day (BID) | ORAL | 0 refills | Status: DC

## 2017-08-07 NOTE — Discharge Instructions (Signed)
Please read attached information. If you experience any new or worsening signs or symptoms please return to the emergency room for evaluation. Please follow-up with your primary care provider or specialist as discussed. Please use medication prescribed only as directed and discontinue taking if you have any concerning signs or symptoms.   °

## 2017-08-07 NOTE — ED Notes (Signed)
Patient transported to CT 

## 2017-08-07 NOTE — ED Notes (Signed)
Jeff PA at bedside.  

## 2017-08-07 NOTE — ED Provider Notes (Signed)
[place] DEPT Provider Note   CSN: [id number] Arrival date & time: 08/07/17  0177     History   Chief Complaint Chief Complaint  Patient presents with  . Abdominal Pain  . Flank Pain    HPI Laura Johns is a 66 y.o. female.  HPI  66 year old female with a history of hyperlipidemia, hypertension, diverticulitis presents with complaints of abdominal discomfort. Patient first noticed the symptoms early on in July. She was seen in the emergency room and diagnosed with uncomplicated diverticulitis. She is discharged home on oral antibiotics, she was unable to tolerate them at that time. She came back into the hospital was admitted for IV antibiotics. Patient notes she was discharged on July 21 since then has had an proven symptoms, but continues to have very minor waxing and waning of abdominal pain. Patient notes recently symptoms have become more severe with pain in her right upper quadrant and epigastric region. She notes associated belching. She notes her presentation today is not as severe as previous episodes of abdominal pain. Patient notes taking Aleve several days ago which seemed to improve her symptoms, but has not used any pain medication since. Patient reports some nausea, denies any vomiting. Patient notes she did have diarrhea after taking antibiotics and was prescribed medication which has resolved this. Patient denies any fevers at home, denies any cough, congestion or shortness of breath, denies any chest pain.   Past Medical History:  Diagnosis Date  . Diverticulitis   . Hyperlipemia   . Hypertension     Patient Active Problem List   Diagnosis Date Noted  . Diverticulitis 07/17/2017  . HLD (hyperlipidemia) 07/17/2017  . HTN (hypertension) 07/17/2017    Past Surgical History:  Procedure Laterality Date  . ABDOMINAL HYSTERECTOMY    . CATARACT EXTRACTION W/PHACO Right 08/25/2014   Procedure: CATARACT EXTRACTION PHACO AND INTRAOCULAR LENS PLACEMENT (IOC);   Surgeon: Tonny Branch, MD;  Location: [facility];  Service: Ophthalmology;  Laterality: Right;  CDE 5.82    OB History    No data available       Home Medications    Prior to Admission medications   Medication Sig Start Date End Date Taking? Authorizing Provider  aspirin EC 81 MG tablet Take 81 mg by mouth daily.   Yes [provider]  Calcium Carb-Cholecalciferol (CALCIUM 600+D3) 600-800 MG-UNIT TABS Take 1 tablet by mouth daily.    Yes [provider]  hydrochlorothiazide (HYDRODIURIL) 25 MG tablet Take 25 mg by mouth daily.   Yes [provider]  lisinopril (PRINIVIL,ZESTRIL) 10 MG tablet Take 10 mg by mouth daily.   Yes [provider]  ciprofloxacin (CIPRO) 500 MG tablet Take 1 tablet (500 mg total) by mouth every 12 (twelve) hours. 08/07/17   Aldridge Krzyzanowski, Dellis Filbert, PA-C  metroNIDAZOLE (FLAGYL) 500 MG tablet Take 1 tablet (500 mg total) by mouth 2 (two) times daily. 08/07/17   Delorice Bannister, Dellis Filbert, PA-C  ondansetron (ZOFRAN ODT) 4 MG disintegrating tablet Take 1 tablet (4 mg total) by mouth every 8 (eight) hours as needed for nausea or vomiting. 08/07/17   Havana Baldwin, Dellis Filbert, PA-C  traMADol (ULTRAM) 50 MG tablet Take 1 tablet (50 mg total) by mouth every 6 (six) hours as needed. 08/07/17 08/07/18  Okey Regal, PA-C    Family History Family History  Problem Relation Age of Onset  . Stroke Mother   . Heart attack Father   . Stroke Father   . Breast cancer Neg Hx     Social  History Social History  Substance Use Topics  . Smoking status: Never Smoker  . Smokeless tobacco: Never Used  . Alcohol use No     Allergies   Atorvastatin; Lovastatin; Pravastatin; Contrast media [iodinated diagnostic agents]; Morphine and related; and Nexium [esomeprazole magnesium]   Review of Systems Review of Systems  All other systems reviewed and are negative.    Physical Exam Updated Vital Signs BP (!) 136/94   Pulse (!) 56   Temp 98.5 F (36.9 C) (Oral)   Resp 10    SpO2 100%   Physical Exam  Constitutional: She is oriented to person, place, and time. She appears well-developed and well-nourished.  HENT:  Head: Normocephalic and atraumatic.  Eyes: Pupils are equal, round, and reactive to light. Conjunctivae are normal. Right eye exhibits no discharge. Left eye exhibits no discharge. No scleral icterus.  Neck: Normal range of motion. No JVD present. No tracheal deviation present.  Cardiovascular: Normal rate, regular rhythm, normal heart sounds and intact distal pulses.  Exam reveals no gallop and no friction rub.   No murmur heard. Pulmonary/Chest: Effort normal and breath sounds normal. No stridor. No respiratory distress. She has no wheezes. She has no rales. She exhibits no tenderness.  Abdominal: Soft. Bowel sounds are normal. She exhibits no distension and no mass. There is no rebound and no guarding. No hernia.  TTP of right upper/epigastric and LLQ  Neurological: She is alert and oriented to person, place, and time. Coordination normal.  Psychiatric: She has a normal mood and affect. Her behavior is normal. Judgment and thought content normal.  Nursing note and vitals reviewed.    ED Treatments / Results  Labs (all labs ordered are listed, but only abnormal results are displayed) Labs Reviewed  COMPREHENSIVE METABOLIC PANEL - Abnormal; Notable for the following:       Result Value   Potassium 3.3 (*)    Creatinine, Ser 1.09 (*)    Total Protein 6.4 (*)    Alkaline Phosphatase 36 (*)    GFR calc non Af Amer 52 (*)    GFR calc Af Amer 60 (*)    All other components within normal limits  CBC - Abnormal; Notable for the following:    MCV 75.2 (*)    MCH 25.4 (*)    All other components within normal limits  URINALYSIS, ROUTINE W REFLEX MICROSCOPIC - Abnormal; Notable for the following:    Ketones, ur 5 (*)    Leukocytes, UA MODERATE (*)    Bacteria, UA RARE (*)    Squamous Epithelial / LPF 0-5 (*)    All other components within normal  limits  LIPASE, BLOOD    EKG  EKG Interpretation None       Radiology Ct Abdomen Pelvis Wo Contrast  Result Date: 08/07/2017 CLINICAL DATA:  66 year old female with acute abdominal and pelvic pain. History of recent diverticulitis. EXAM: CT ABDOMEN AND PELVIS WITHOUT CONTRAST TECHNIQUE: Multidetector CT imaging of the abdomen and pelvis was performed following the standard protocol without IV contrast. COMPARISON:  07/12/2017 and prior CTs FINDINGS: Please note that parenchymal abnormalities may be missed without intravenous contrast. Lower chest: No acute abnormality Hepatobiliary: The liver and gallbladder are unremarkable. No biliary dilatation. Pancreas: Unremarkable. Spleen: Unremarkable. Adrenals/Urinary Tract: A 1 cm left adrenal adenoma is again noted. A punctate nonobstructing right renal calculus is again identified. There is no evidence of hydronephrosis or obstructing urinary calculi. The right adrenal gland and bladder are unremarkable. Stomach/Bowel: Equivocal focal wall  thickening distal descending colon with possible adjacent inflammation noted -question mild acute diverticulitis. Acute diverticulitis changes of the sigmoid colon have resolved. There is no evidence of bowel obstruction or other bowel wall thickening. The appendix is normal. Vascular/Lymphatic: Aortic atherosclerosis. No enlarged abdominal or pelvic lymph nodes. Reproductive: Uterus and bilateral adnexa are unremarkable. Other: No ascites, focal collection or pneumoperitoneum. Musculoskeletal: No acute abnormality. IMPRESSION: Equivocal mild diverticulitis of the distal descending colon. No evidence of abscess or pneumoperitoneum. Resolved diverticulitis changes involving the sigmoid colon as identified on the previous exam. Punctate nonobstructing right renal calculus. Aortic Atherosclerosis (ICD10-I70.0). Electronically Signed   By: Margarette Canada M.D.   On: 08/07/2017 13:11    Procedures Procedures (including critical  care time)  Medications Ordered in ED Medications  gi cocktail (Maalox,Lidocaine,Donnatal) (30 mLs Oral Given 08/07/17 1015)     Initial Impression / Assessment and Plan / ED Course  I have reviewed the triage vital signs and the nursing notes.  Pertinent labs & imaging results that were available during my care of the patient were reviewed by me and considered in my medical decision making (see chart for details).       Final Clinical Impressions(s) / ED Diagnoses   Final diagnoses:  Diverticulitis   Labs: Urinalysis, lipase, CMP, CBC  Imaging: CT abdomen pelvis without  Consults:  Therapeutics:  Discharge Meds: Cipro, metronidazole, Ultram  Assessment/Plan: 66 year old female presents today with complaints of abdominal pain. Patient has a significant past medical history of diverticulitis. This is slightly different location. Patient is unable to have CT with contrast without prep. Patient had good visualization with CT without contrast before. CT here without contrast shows mild diverticulitis of the descending colon, resolved diverticulitis changes involving the sigmoid colon. Patient is afebrile nontoxic tolerating by mouth. She has a very reassuring laboratory analysis. Patient is a good candidate for outpatient antibiotics. I discussed antibiotics, follow-up and return precautions with both the patient and her family. Everyone agreed to today's evaluation and management, they understood strict return precautions, they had no further questions concerns at the time discharge. I had a low suspicion for any other acute cause of patient's abdominal pain other than that listed above. Patient displayed no signs of chest pain, shortness of breath, or any other acute life-threatening etiologies.    New Prescriptions Discharge Medication List as of 08/07/2017  1:54 PM    START taking these medications   Details  ciprofloxacin (CIPRO) 500 MG tablet Take 1 tablet (500 mg total) by  mouth every 12 (twelve) hours., Starting Thu 08/07/2017, Print         Leiana Rund, [place], PA-C 08/07/17 1612    Milton Ferguson, MD 08/08/17 0900

## 2017-08-07 NOTE — ED Notes (Signed)
Pt has finished first bottle of contrast, will start second bottle at 11:15.

## 2017-08-07 NOTE — ED Notes (Signed)
Pt has finished second bottle of contrast, this RN called CT to notify them, pt will be scanned around 12:15.

## 2017-08-07 NOTE — ED Triage Notes (Signed)
Patient seen twice at [facility] for abdominal pain and diverticulitis flare.  Patient states that it has continued, she gets sick after taking the antibiotics which made her feel worse.  She states that the pain is continuing and she also is having epigastric pain with the abdominal pain.

## 2017-08-19 ENCOUNTER — Other Ambulatory Visit
Admission: RE | Admit: 2017-08-19 | Discharge: 2017-08-19 | Disposition: A | Discharge status: Home / Self Care | Payer: BLUE CROSS/BLUE SHIELD | Source: Ambulatory Visit | Attending: Student | Admitting: Student

## 2017-08-19 DIAGNOSIS — B9689 Other specified bacterial agents as the cause of diseases classified elsewhere: Secondary | ICD-10-CM | POA: Diagnosis present

## 2017-08-19 DIAGNOSIS — R197 Diarrhea, unspecified: Secondary | ICD-10-CM | POA: Insufficient documentation

## 2017-08-22 LAB — MISC LABCORP TEST (SEND OUT): LABCORP TEST CODE: 86207

## 2017-08-26 LAB — CALPROTECTIN, FECAL: Calprotectin, Fecal: 40 ug/g (ref 0–120)

## 2017-09-04 ENCOUNTER — Encounter: Payer: Self-pay | Admitting: *Deleted

## 2017-09-05 ENCOUNTER — Ambulatory Visit: Payer: BLUE CROSS/BLUE SHIELD | Admitting: Anesthesiology

## 2017-09-05 ENCOUNTER — Ambulatory Visit
Admission: RE | Admit: 2017-09-05 | Discharge: 2017-09-05 | Disposition: A | Discharge status: Home / Self Care | Payer: BLUE CROSS/BLUE SHIELD | Source: Ambulatory Visit | Attending: Unknown Physician Specialty | Admitting: Unknown Physician Specialty

## 2017-09-05 ENCOUNTER — Encounter
Admission: RE | Disposition: A | Discharge status: Home / Self Care | Payer: Self-pay | Source: Ambulatory Visit | Attending: Unknown Physician Specialty

## 2017-09-05 ENCOUNTER — Encounter: Payer: Self-pay | Admitting: *Deleted

## 2017-09-05 DIAGNOSIS — Z8249 Family history of ischemic heart disease and other diseases of the circulatory system: Secondary | ICD-10-CM | POA: Insufficient documentation

## 2017-09-05 DIAGNOSIS — K5730 Diverticulosis of large intestine without perforation or abscess without bleeding: Secondary | ICD-10-CM | POA: Insufficient documentation

## 2017-09-05 DIAGNOSIS — R197 Diarrhea, unspecified: Secondary | ICD-10-CM | POA: Insufficient documentation

## 2017-09-05 DIAGNOSIS — K640 First degree hemorrhoids: Secondary | ICD-10-CM | POA: Insufficient documentation

## 2017-09-05 DIAGNOSIS — Z79899 Other long term (current) drug therapy: Secondary | ICD-10-CM | POA: Diagnosis not present

## 2017-09-05 DIAGNOSIS — Z91041 Radiographic dye allergy status: Secondary | ICD-10-CM | POA: Insufficient documentation

## 2017-09-05 DIAGNOSIS — K449 Diaphragmatic hernia without obstruction or gangrene: Secondary | ICD-10-CM | POA: Diagnosis not present

## 2017-09-05 DIAGNOSIS — Z888 Allergy status to other drugs, medicaments and biological substances status: Secondary | ICD-10-CM | POA: Diagnosis not present

## 2017-09-05 DIAGNOSIS — E785 Hyperlipidemia, unspecified: Secondary | ICD-10-CM | POA: Diagnosis not present

## 2017-09-05 DIAGNOSIS — I10 Essential (primary) hypertension: Secondary | ICD-10-CM | POA: Diagnosis not present

## 2017-09-05 DIAGNOSIS — Z823 Family history of stroke: Secondary | ICD-10-CM | POA: Insufficient documentation

## 2017-09-05 HISTORY — PX: COLONOSCOPY WITH PROPOFOL: SHX5780

## 2017-09-05 HISTORY — PX: ESOPHAGOGASTRODUODENOSCOPY (EGD) WITH PROPOFOL: SHX5813

## 2017-09-05 SURGERY — ESOPHAGOGASTRODUODENOSCOPY (EGD) WITH PROPOFOL
Anesthesia: General

## 2017-09-05 MED ORDER — ONDANSETRON HCL 4 MG/2ML IJ SOLN
INTRAMUSCULAR | Status: DC | PRN
  Administered 2017-09-05: 4 mg via INTRAVENOUS

## 2017-09-05 MED ORDER — LIDOCAINE HCL (PF) 2 % IJ SOLN
INTRAMUSCULAR | Status: AC
  Filled 2017-09-05: qty 2

## 2017-09-05 MED ORDER — LIDOCAINE HCL (CARDIAC) 20 MG/ML IV SOLN
INTRAVENOUS | Status: DC | PRN
  Administered 2017-09-05: 40 mg via INTRAVENOUS

## 2017-09-05 MED ORDER — PROPOFOL 500 MG/50ML IV EMUL
INTRAVENOUS | Status: AC
  Filled 2017-09-05: qty 50

## 2017-09-05 MED ORDER — MIDAZOLAM HCL 2 MG/2ML IJ SOLN
INTRAMUSCULAR | Status: DC | PRN
  Administered 2017-09-05 (×2): 1 mg via INTRAVENOUS

## 2017-09-05 MED ORDER — PROPOFOL 500 MG/50ML IV EMUL
INTRAVENOUS | Status: DC | PRN
  Administered 2017-09-05: 120 ug/kg/min via INTRAVENOUS

## 2017-09-05 MED ORDER — PROPOFOL 10 MG/ML IV BOLUS
INTRAVENOUS | Status: DC | PRN
  Administered 2017-09-05: 20 mg via INTRAVENOUS
  Administered 2017-09-05: 30 mg via INTRAVENOUS

## 2017-09-05 MED ORDER — SODIUM CHLORIDE 0.9 % IV SOLN
INTRAVENOUS | Status: DC
Start: 2017-09-05 — End: 2017-09-05
  Administered 2017-09-05: 12:00:00 via INTRAVENOUS

## 2017-09-05 MED ORDER — MIDAZOLAM HCL 2 MG/2ML IJ SOLN
INTRAMUSCULAR | Status: AC
  Filled 2017-09-05: qty 2

## 2017-09-05 NOTE — Anesthesia Postprocedure Evaluation (Signed)
Anesthesia Post Note  Patient: Laura Johns  Procedure(s) Performed: Procedure(s) (LRB): ESOPHAGOGASTRODUODENOSCOPY (EGD) WITH PROPOFOL (N/A) COLONOSCOPY WITH PROPOFOL (N/A)  Patient location during evaluation: Endoscopy Anesthesia Type: General Level of consciousness: awake and alert Pain management: pain level controlled Vital Signs Assessment: post-procedure vital signs reviewed and stable Respiratory status: spontaneous breathing, nonlabored ventilation, respiratory function stable and patient connected to nasal cannula oxygen Cardiovascular status: blood pressure returned to baseline and stable Postop Assessment: no signs of nausea or vomiting Anesthetic complications: no     Last Vitals:  Vitals:   09/05/17 1444 09/05/17 1454  BP: 108/72 120/78  Pulse: (!) 56 (!) 56  Resp: 18 14  Temp:    SpO2: 100% 99%    Last Pain:  Vitals:   09/05/17 1434  TempSrc:   PainSc: Asleep                 Dalaina Tates S

## 2017-09-05 NOTE — Transfer of Care (Signed)
Immediate Anesthesia Transfer of Care Note  Patient: Laura Johns  Procedure(s) Performed: Procedure(s): ESOPHAGOGASTRODUODENOSCOPY (EGD) WITH PROPOFOL (N/A) COLONOSCOPY WITH PROPOFOL (N/A)  Patient Location: PACU  Anesthesia Type:General  Level of Consciousness: sedated  Airway & Oxygen Therapy: Patient Spontanous Breathing and Patient connected to nasal cannula oxygen  Post-op Assessment: Report given to RN and Post -op Vital signs reviewed and stable  Post vital signs: Reviewed and stable  Last Vitals:  Vitals:   09/05/17 1112 09/05/17 1424  BP: 132/82 (!) 86/57  Pulse: (!) 59 66  Resp: 16 (!) 28  Temp: 36.4 C   SpO2: 100% 95%    Last Pain:  Vitals:   09/05/17 1424  TempSrc:   PainSc: Asleep         Complications: No apparent anesthesia complications

## 2017-09-05 NOTE — Anesthesia Preprocedure Evaluation (Signed)
Anesthesia Evaluation  Patient identified by MRN, date of birth, ID band Patient awake    Reviewed: Allergy & Precautions, H&P , NPO status , Patient's Chart, lab work & pertinent test results  History of Anesthesia Complications Negative for: history of anesthetic complications  Airway Mallampati: III  TM Distance: <3 FB Neck ROM: limited    Dental  (+) Poor Dentition, Chipped, Missing   Pulmonary neg pulmonary ROS, neg shortness of breath,           Cardiovascular Exercise Tolerance: Good hypertension, (-) angina(-) Past MI and (-) DOE      Neuro/Psych negative neurological ROS  negative psych ROS   GI/Hepatic negative GI ROS, Neg liver ROS, neg GERD  ,  Endo/Other  negative endocrine ROS  Renal/GU negative Renal ROS  negative genitourinary   Musculoskeletal   Abdominal   Peds  Hematology negative hematology ROS (+)   Anesthesia Other Findings Past Medical History: No date: Diverticulitis No date: Hyperlipemia No date: Hypertension  Past Surgical History: No date: ABDOMINAL HYSTERECTOMY 08/25/2014: CATARACT EXTRACTION W/PHACO; Right     Comment:  Procedure: CATARACT EXTRACTION PHACO AND INTRAOCULAR               LENS PLACEMENT (IOC);  Surgeon: Tonny Branch, MD;                Location: [facility];  Service: Ophthalmology;  Laterality:               Right;  CDE 5.82  BMI    Body Mass Index:  23.43 kg/m      Reproductive/Obstetrics negative OB ROS                             Anesthesia Physical Anesthesia Plan  ASA: III  Anesthesia Plan: General   Post-op Pain Management:    Induction: Intravenous  PONV Risk Score and Plan: Propofol infusion  Airway Management Planned: Natural Airway and Nasal Cannula  Additional Equipment:   Intra-op Plan:   Post-operative Plan:   Informed Consent: I have reviewed the patients History and Physical, chart, labs and discussed the  procedure including the risks, benefits and alternatives for the proposed anesthesia with the patient or authorized representative who has indicated his/her understanding and acceptance.   Dental Advisory Given  Plan Discussed with: Anesthesiologist, CRNA and Surgeon  Anesthesia Plan Comments: (Patient consented for risks of anesthesia including but not limited to:  - adverse reactions to medications - risk of intubation if required - damage to teeth, lips or other oral mucosa - sore throat or hoarseness - Damage to heart, brain, lungs or loss of life  Patient voiced understanding.)        Anesthesia Quick Evaluation

## 2017-09-05 NOTE — H&P (Signed)
Primary Care Physician:  [name], MD Primary Gastroenterologist:  Dr. Vira Agar  Pre-Procedure History & Physical: HPI:  Laura Johns is a 66 y.o. female is here for an endoscopy and colonoscopy.   Past Medical History:  Diagnosis Date  . Diverticulitis   . Hyperlipemia   . Hypertension     Past Surgical History:  Procedure Laterality Date  . ABDOMINAL HYSTERECTOMY    . CATARACT EXTRACTION W/PHACO Right 08/25/2014   Procedure: CATARACT EXTRACTION PHACO AND INTRAOCULAR LENS PLACEMENT (IOC);  Surgeon: Tonny Branch, MD;  Location: [facility];  Service: Ophthalmology;  Laterality: Right;  CDE 5.82    Prior to Admission medications   Medication Sig Start Date End Date Taking? Authorizing Provider  aspirin EC 81 MG tablet Take 81 mg by mouth daily.   Yes [provider]  Calcium Carb-Cholecalciferol (CALCIUM 600+D3) 600-800 MG-UNIT TABS Take 1 tablet by mouth daily.    Yes [provider]  hydrochlorothiazide (HYDRODIURIL) 25 MG tablet Take 25 mg by mouth daily.   Yes [provider]  lisinopril (PRINIVIL,ZESTRIL) 10 MG tablet Take 10 mg by mouth daily.   Yes [provider]  ondansetron (ZOFRAN ODT) 4 MG disintegrating tablet Take 1 tablet (4 mg total) by mouth every 8 (eight) hours as needed for nausea or vomiting. 08/07/17  Yes Hedges, Dellis Filbert, PA-C  ciprofloxacin (CIPRO) 500 MG tablet Take 1 tablet (500 mg total) by mouth every 12 (twelve) hours. Patient not taking: Reported on 09/05/2017 08/07/17   Hedges, Dellis Filbert, PA-C  metroNIDAZOLE (FLAGYL) 500 MG tablet Take 1 tablet (500 mg total) by mouth 2 (two) times daily. Patient not taking: Reported on 09/05/2017 08/07/17   Hedges, Dellis Filbert, PA-C  traMADol (ULTRAM) 50 MG tablet Take 1 tablet (50 mg total) by mouth every 6 (six) hours as needed. Patient not taking: Reported on 09/05/2017 08/07/17 08/07/18  Okey Regal, PA-C    Allergies as of 09/04/2017 - Review Complete 09/04/2017  Allergen Reaction Noted   . Atorvastatin Other (See Comments) 06/30/2014  . Lovastatin Other (See Comments) 06/11/2017  . Pravastatin Other (See Comments) 07/15/2017  . Contrast media [iodinated diagnostic agents] Rash 07/17/2017  . Morphine and related Anxiety 07/17/2017  . Nexium [esomeprazole magnesium] Rash 07/17/2017    Family History  Problem Relation Age of Onset  . Stroke Mother   . Heart attack Father   . Stroke Father   . Breast cancer Neg Hx     Social History   Social History  . Marital status: Married    Spouse name: N/A  . Number of children: N/A  . Years of education: N/A   Occupational History  . Not on file.   Social History Main Topics  . Smoking status: Never Smoker  . Smokeless tobacco: Never Used  . Alcohol use No  . Drug use: No  . Sexual activity: Yes   Other Topics Concern  . Not on file   Social History Narrative  . No narrative on file    Review of Systems: See HPI, otherwise negative ROS  Physical Exam: BP 132/82   Pulse (!) 59   Temp 97.6 F (36.4 C) (Tympanic)   Resp 16   Ht 5\' 1"  (1.549 m)   Wt 56.2 kg (124 lb)   SpO2 100%   BMI 23.43 kg/m  General:   Alert,  pleasant and cooperative in NAD Head:  Normocephalic and atraumatic. Neck:  Supple; no masses or thyromegaly. Lungs:  Clear throughout to auscultation.  Heart:  Regular rate and rhythm. Abdomen:  Soft, nontender and nondistended. Normal bowel sounds, without guarding, and without rebound.   Neurologic:  Alert and  oriented x4;  grossly normal neurologically.  Impression/Plan: Laura Johns is here for an endoscopy and colonoscopy to be performed for diverticulitis and diverticulosis and globus sensation in throat.  Risks, benefits, limitations, and alternatives regarding  endoscopy and colonoscopy have been reviewed with the patient.  Questions have been answered.  All parties agreeable.   Gaylyn Cheers, MD  09/05/2017, 1:47 PM

## 2017-09-05 NOTE — Anesthesia Post-op Follow-up Note (Signed)
Anesthesia QCDR form completed.        

## 2017-09-05 NOTE — Op Note (Signed)
[facility] Gastroenterology Patient Name: Laura Johns Procedure Date: 09/05/2017 1:50 PM MRN: [id number] Account #: [id number] Date of Birth: [birth year] Admit Type: Outpatient Age: 66 Room: [facility] Main ENDO ROOM 1 Gender: Female Note Status: Finalized Procedure:            Upper GI endoscopy Indications:          Globus sensation Providers:            Manya Silvas, MD Referring MD:         [name] (Referring MD) Medicines:            Propofol per Anesthesia Complications:        No immediate complications. Procedure:            Pre-Anesthesia Assessment:                       - After reviewing the risks and benefits, the patient                        was deemed in satisfactory condition to undergo the                        procedure.                       After obtaining informed consent, the endoscope was                        passed under direct vision. Throughout the procedure,                        the patient's blood pressure, pulse, and oxygen                        saturations were monitored continuously. The Endoscope                        was introduced through the mouth, and advanced to the                        second part of duodenum. The upper GI endoscopy was                        accomplished without difficulty. The patient tolerated                        the procedure well. Findings:      The examined esophagus was normal. GEJ 36cm.      A medium-sized hiatal hernia was present.      The stomach was otherwise normal.      The examined duodenum was normal. Impression:           - Normal esophagus.                       - Medium-sized hiatal hernia.                       - Normal stomach.                       - Normal examined duodenum.                       -  No specimens collected. Recommendation:       - Perform a colonoscopy as previously scheduled. Manya Silvas, MD 09/05/2017 2:09:40 PM This report has been signed  electronically. Number of Addenda: 0 Note Initiated On: 09/05/2017 1:50 PM      [facility]

## 2017-09-05 NOTE — Op Note (Signed)
[facility] Gastroenterology Patient Name: Laura Johns Procedure Date: 09/05/2017 1:49 PM MRN: [id number] Account #: [id number] Date of Birth: 09/17/1951 Admit Type: Outpatient Age: 66 Room: [facility] ENDO ROOM 1 Gender: Female Note Status: Finalized Procedure:            Colonoscopy Indications:          Diarrhea, Diarrhea of presumed infectious origin Providers:            Manya Silvas, MD Referring MD:         [name] (Referring MD) Medicines:            Propofol per Anesthesia Complications:        No immediate complications. Procedure:            Pre-Anesthesia Assessment:                       - After reviewing the risks and benefits, the patient                        was deemed in satisfactory condition to undergo the                        procedure.                       After obtaining informed consent, the colonoscope was                        passed under direct vision. Throughout the procedure,                        the patient's blood pressure, pulse, and oxygen                        saturations were monitored continuously. The                        Colonoscope was introduced through the anus and                        advanced to the the cecum, identified by appendiceal                        orifice and ileocecal valve. The colonoscopy was                        performed without difficulty. The patient tolerated the                        procedure well. The quality of the bowel preparation                        was good. Findings:      Many small-mouthed diverticula were found in the sigmoid colon and       descending colon.      Internal hemorrhoids were found during endoscopy. The hemorrhoids were       small and Grade I (internal hemorrhoids that do not prolapse).      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 Impression:           -  Diverticulosis in the  sigmoid colon and in the                        descending colon.                       - Internal hemorrhoids.                       - No specimens collected. Recommendation:       Vancomycin for 14 days. follow up in my office in 3                        weeks.                       - The findings and recommendations were discussed with                        the patient's family. Manya Silvas, MD 09/05/2017 2:26:53 PM This report has been signed electronically. Number of Addenda: 0 Note Initiated On: 09/05/2017 1:49 PM Scope Withdrawal Time: 0 hours 2 minutes 38 seconds  Total Procedure Duration: 0 hours 8 minutes 58 seconds       [facility]

## 2017-09-05 NOTE — Anesthesia Procedure Notes (Signed)
Date/Time: 09/05/2017 1:52 PM Performed by: Johnna Acosta Pre-anesthesia Checklist: Patient identified, Emergency Drugs available, Suction available, Patient being monitored and Timeout performed Patient Re-evaluated:Patient Re-evaluated prior to induction Oxygen Delivery Method: Nasal cannula

## 2017-09-08 ENCOUNTER — Encounter: Payer: Self-pay | Admitting: Unknown Physician Specialty

## 2017-11-11 ENCOUNTER — Other Ambulatory Visit
Admission: RE | Admit: 2017-11-11 | Discharge: 2017-11-11 | Disposition: A | Discharge status: Home / Self Care | Payer: BLUE CROSS/BLUE SHIELD | Source: Ambulatory Visit | Attending: Student | Admitting: Student

## 2017-11-11 DIAGNOSIS — R1032 Left lower quadrant pain: Secondary | ICD-10-CM | POA: Diagnosis present

## 2017-11-11 DIAGNOSIS — R197 Diarrhea, unspecified: Secondary | ICD-10-CM | POA: Insufficient documentation

## 2017-11-11 DIAGNOSIS — R1031 Right lower quadrant pain: Secondary | ICD-10-CM | POA: Diagnosis not present

## 2017-11-11 LAB — GASTROINTESTINAL PANEL BY PCR, STOOL (REPLACES STOOL CULTURE)

## 2017-11-11 LAB — C DIFFICILE QUICK SCREEN W PCR REFLEX
C DIFFICILE (CDIFF) TOXIN: NEGATIVE
C Diff antigen: NEGATIVE
C Diff interpretation: NOT DETECTED

## 2018-06-17 ENCOUNTER — Other Ambulatory Visit: Payer: Self-pay | Admitting: Obstetrics and Gynecology

## 2018-06-17 DIAGNOSIS — Z1239 Encounter for other screening for malignant neoplasm of breast: Secondary | ICD-10-CM

## 2018-07-08 ENCOUNTER — Ambulatory Visit
Admission: RE | Admit: 2018-07-08 | Discharge: 2018-07-08 | Disposition: A | Discharge status: Home / Self Care | Payer: BLUE CROSS/BLUE SHIELD | Source: Ambulatory Visit | Attending: Obstetrics and Gynecology | Admitting: Obstetrics and Gynecology

## 2018-07-08 DIAGNOSIS — Z1231 Encounter for screening mammogram for malignant neoplasm of breast: Secondary | ICD-10-CM | POA: Diagnosis not present

## 2018-07-08 DIAGNOSIS — Z1239 Encounter for other screening for malignant neoplasm of breast: Secondary | ICD-10-CM

## 2019-06-21 IMAGING — CT CT ABD-PELV W/O CM
2 of 4 series · 16 of 46 positions shown, 18 images · non-contrast
Comparison: Contrast-enhanced CT yesterday.

CLINICAL DATA: Abdominal pain. Recent diagnosis of diverticulitis.
Concern for abscess or perforation.

EXAM:
CT ABDOMEN AND PELVIS WITHOUT CONTRAST
TECHNIQUE: Multidetector CT imaging of the abdomen and pelvis was performed
following the standard protocol without IV contrast.

[Series 2: routine abd/pel wo · axial · 0.60mm/px · z∈[-602,-258]mm · 13 of 77 slices shown, 15 images]
[im 4/77  soft-tissue]
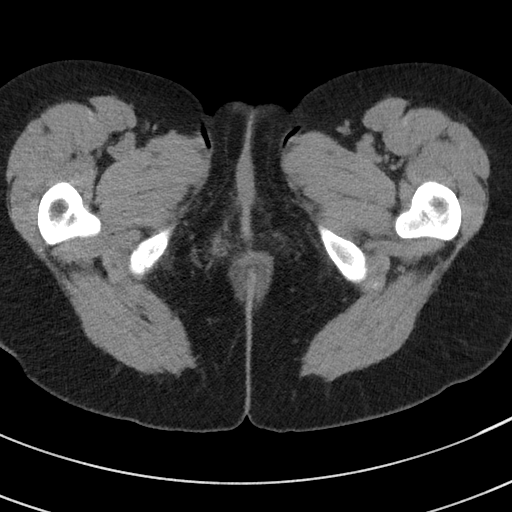
[im 4/77  bone]
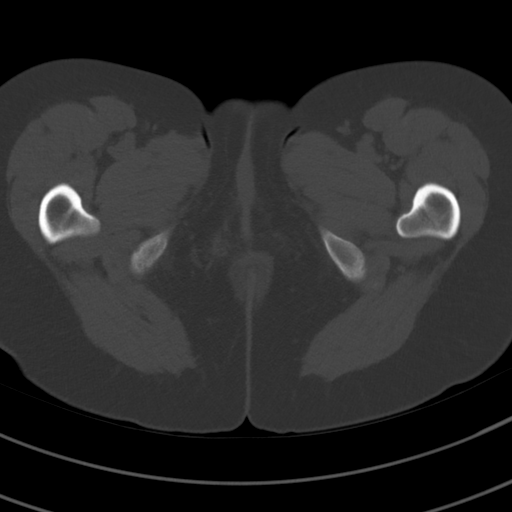
[im 10/77  soft-tissue]
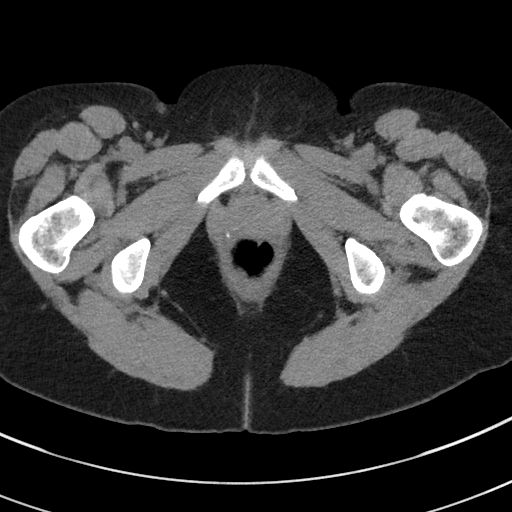
[im 16/77  soft-tissue]
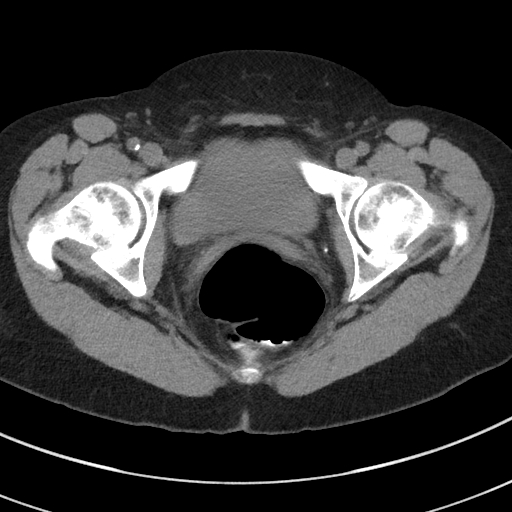
[im 23/77  soft-tissue]
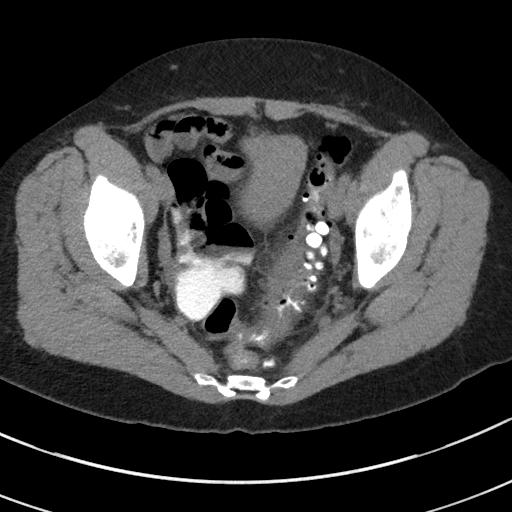
[im 26/77  soft-tissue]
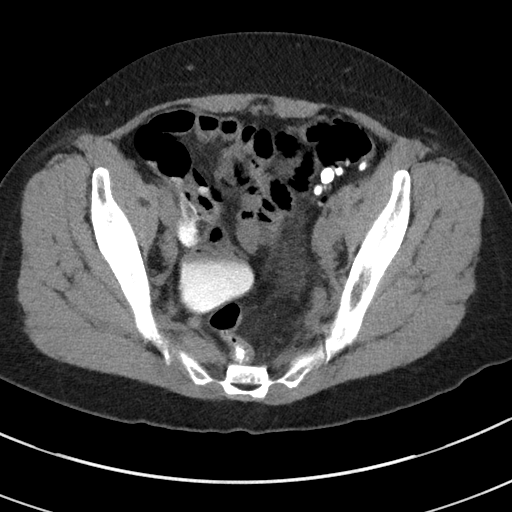
[im 32/77  soft-tissue]
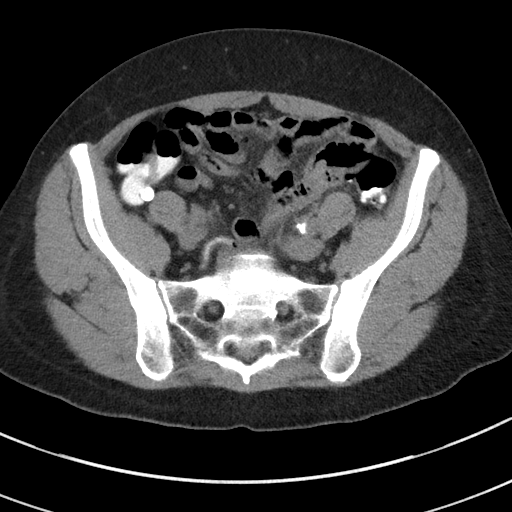
[im 39/77  soft-tissue]
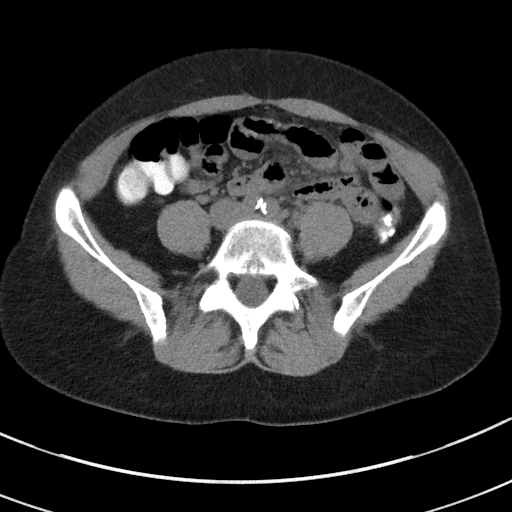
[im 45/77  soft-tissue]
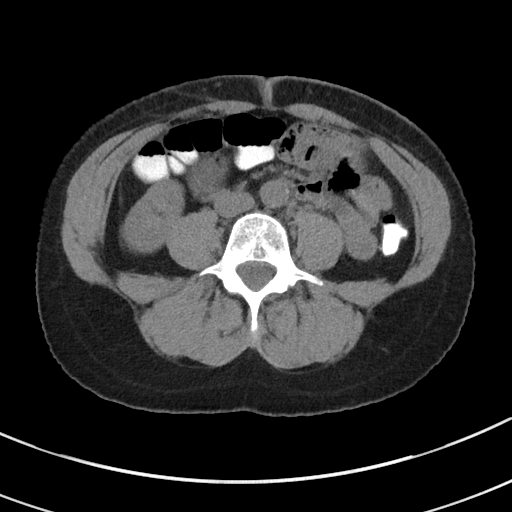
[im 51/77  soft-tissue]
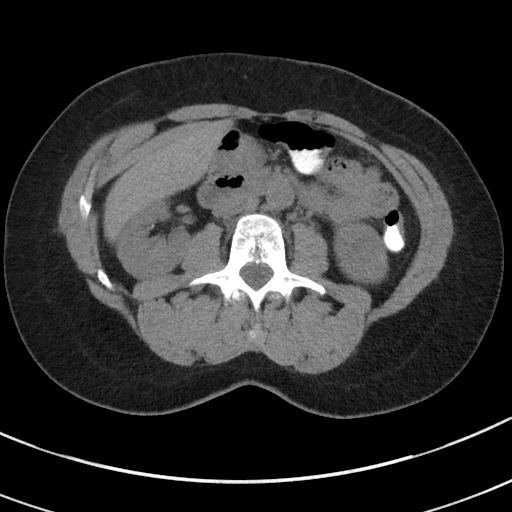
[im 51/77  bone]
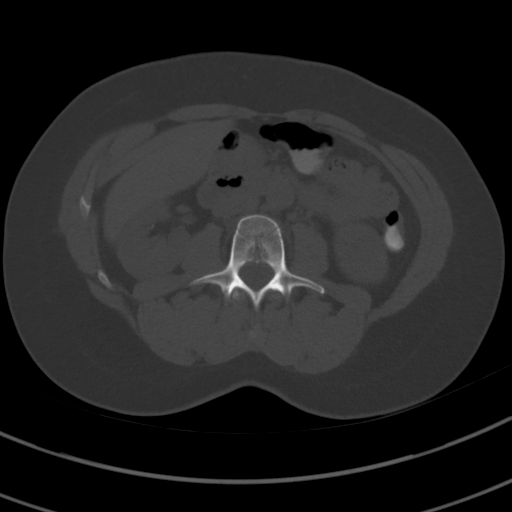
[im 54/77  soft-tissue]
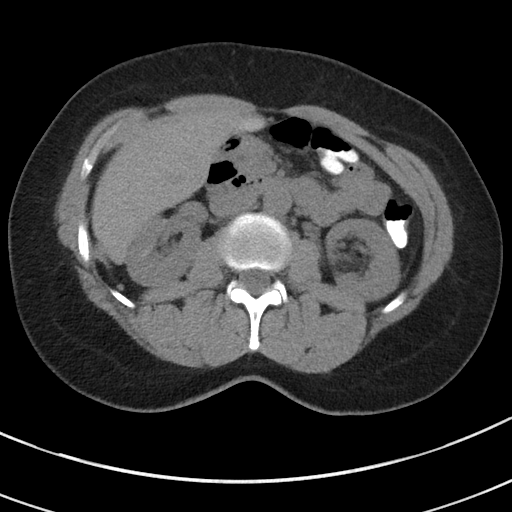
[im 61/77  soft-tissue]
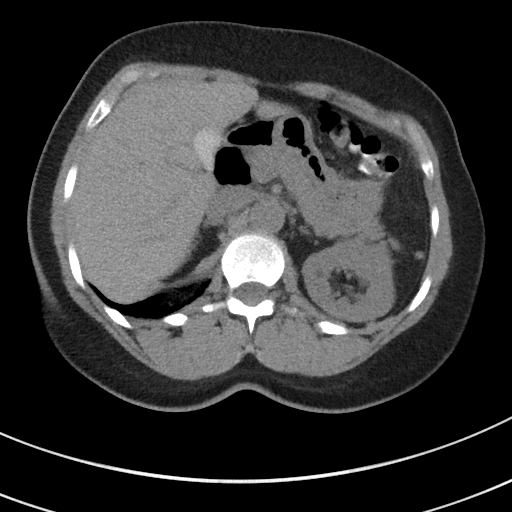
[im 67/77  soft-tissue]
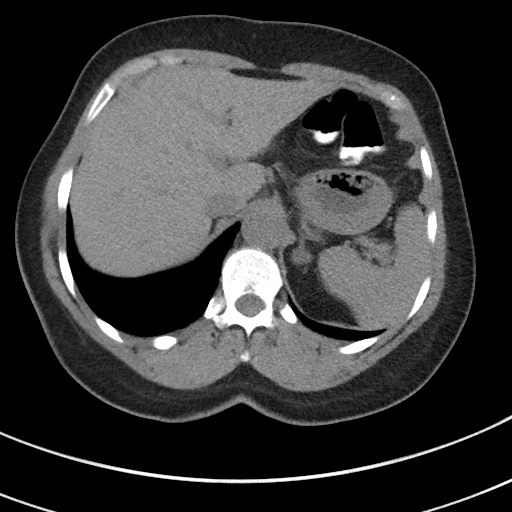
[im 73/77  soft-tissue]
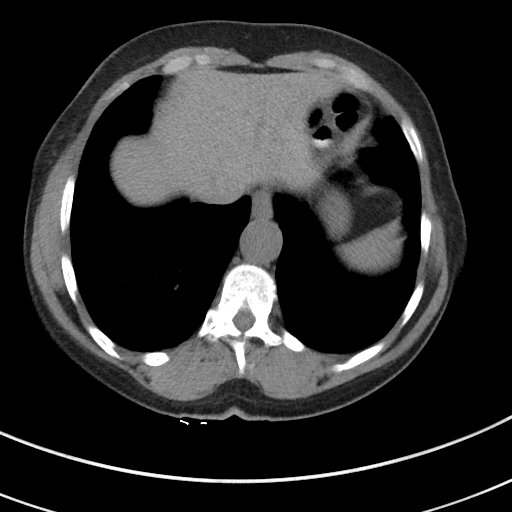

[Series 5: coronal st · coronal · 0.63mm/px · 3 of 79 slices shown]
[im 27/79  soft-tissue]
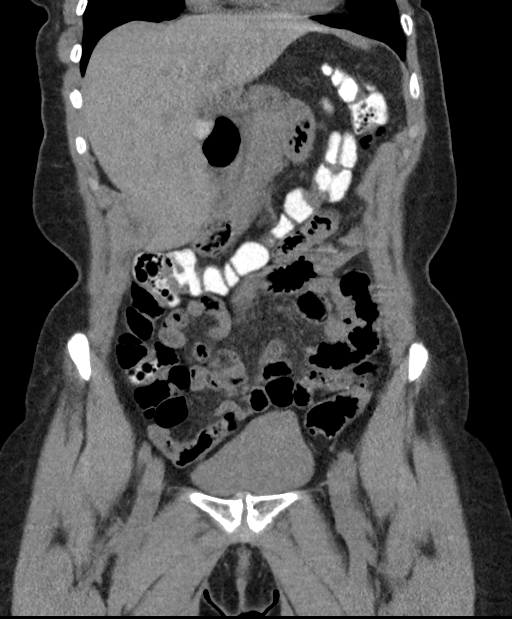
[im 35/79  soft-tissue]
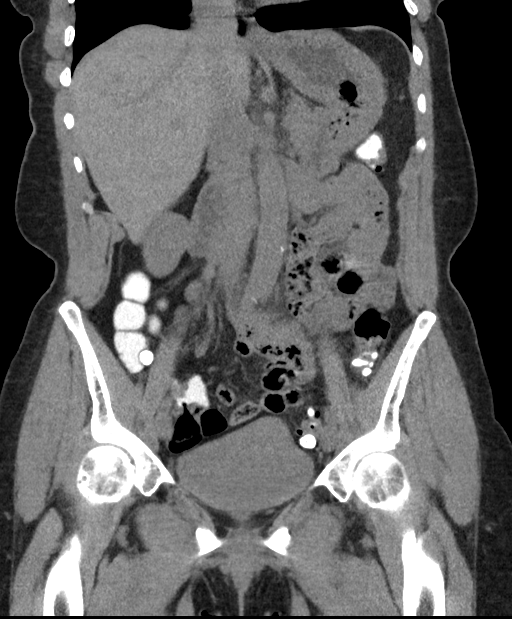
[im 44/79  soft-tissue]
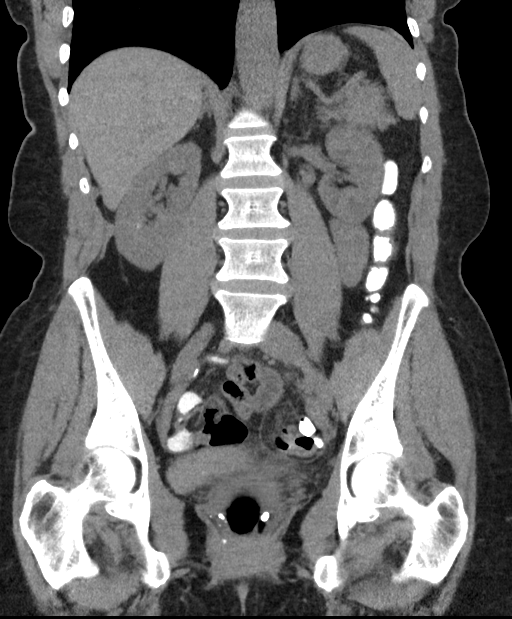

[16 of 46 positions shown; findings below may reference images not displayed]

FINDINGS: Lower chest: The lung bases are clear. No pleural fluid or
consolidation.

Hepatobiliary: Focal fatty infiltration adjacent with falciform
ligament. High-density material in the gallbladder was not seen
yesterday, likely vicarious excretion of contrast. No gallbladder
inflammation.

Pancreas: No ductal dilatation or inflammation.

Spleen: Normal in size without focal abnormality.

Adrenals/Urinary Tract: Normal adrenal glands. Punctate
nonobstructing stone in the right kidney. No hydronephrosis.
Parapelvic cysts in the left kidney, better assessed on prior
contrast-enhanced exam. No hydronephrosis or perinephric edema.
Urinary bladder is physiologically distended. No bladder wall
thickening.

Stomach/Bowel: Colonic wall thickening and pericolonic fat stranding
involving the sigmoid colon in the left lower quadrant, unchanged
from yesterday. No evidence of perforation or abscess. Multiple
additional noninflamed diverticulum in the descending and sigmoid
colon. There is no bowel obstruction. Stomach is decompressed.
Normal appendix.

Vascular/Lymphatic: Aortic atherosclerosis. No aneurysm. No
abdominal or pelvic adenopathy, lack of IV contrast limits
assessment on the current exam.

Reproductive: Status post hysterectomy. No adnexal masses.

Other: No ascites.  No development of free air or abscess.

Musculoskeletal: There are no acute or suspicious osseous
abnormalities.
IMPRESSION: 1. Sigmoid colonic diverticulitis without interval perforation or
abscess.
2. Punctate nonobstructing right renal stone.
3. Aortic atherosclerosis.

## 2019-07-22 ENCOUNTER — Other Ambulatory Visit: Payer: Self-pay | Admitting: Obstetrics and Gynecology

## 2019-07-22 DIAGNOSIS — Z1231 Encounter for screening mammogram for malignant neoplasm of breast: Secondary | ICD-10-CM

## 2019-08-25 ENCOUNTER — Ambulatory Visit
Admission: RE | Admit: 2019-08-25 | Discharge: 2019-08-25 | Disposition: A | Discharge status: Home / Self Care | Payer: Medicare Other | Source: Ambulatory Visit | Attending: Obstetrics and Gynecology | Admitting: Obstetrics and Gynecology

## 2019-08-25 ENCOUNTER — Telehealth: Payer: Self-pay | Admitting: Radiology

## 2019-08-25 DIAGNOSIS — Z1231 Encounter for screening mammogram for malignant neoplasm of breast: Secondary | ICD-10-CM

## 2020-02-03 DIAGNOSIS — Z66 Do not resuscitate: Secondary | ICD-10-CM | POA: Insufficient documentation

## 2020-02-03 DIAGNOSIS — Z0000 Encounter for general adult medical examination without abnormal findings: Secondary | ICD-10-CM | POA: Insufficient documentation

## 2020-04-04 NOTE — H&P (Signed)
Surgical History & Physical  Patient Name: Laura Johns DOB: 1951/10/18  Surgery: Cataract extraction with intraocular lens implant phacoemulsification; Left Eye  Surgeon: Laura Goldmann MD Surgery Date:  04/14/2020 Pre-Op Date:  04/04/2020  HPI: A 60 Yr. old female patient is referred by Laura Johns for cataract eval OS. 1. The patient complains of headlights glare causing poor vision, which began 1 year ago. The left eye is affected. The episode is gradual. The condition's severity increased since last visit. Symptoms occur when the patient is driving and outside. The complaint is associated with glare. This is negatively affecting her quality of life. HPI was performed by Laura Johns .  Medical History: Cataracts Pseudophakia OD 2015 CE w/PCIOL August 2015 OD High Blood Pressure LDL  Review of Systems Negative Allergic/Immunologic Negative Cardiovascular Negative Constitutional Negative Ear, Nose, Mouth & Throat Negative Endocrine Negative Eyes Negative Gastrointestinal Negative Genitourinary Negative Hemotologic/Lymphatic Negative Integumentary Negative Musculoskeletal Negative Neurological Negative Psychiatry Negative Respiratory  Social   Never smoked   Medication Lisinopril, Ezetimite, HCTZ, Vitamin D3,   Sx/Procedures Cataract Extraction OD,   Drug Allergies   NKDA  History & Physical: Heent:  Cataract, Left eye NECK: supple without bruits LUNGS: lungs clear to auscultation CV: regular rate and rhythm Abdomen: soft and non-tender  Impression & Plan: Assessment: 1.  COMBINED FORMS AGE RELATED CATARACT; Left Eye (H25.812) 2.  PCO; Right Eye (H26.491) 3.  INTRAOCULAR LENS IOL (Z96.1)  Plan: 1.  Cataract accounts for the patient's decreased vision. This visual impairment is not correctable with a tolerable change in glasses or contact lenses. Cataract surgery with an implantation of a new lens should significantly improve the visual and functional status  of the patient. Discussed all risks, benefits, alternatives, and potential complications. Discussed the procedures and recovery. Patient desires to have surgery. A-scan ordered and performed today for intra-ocular lens calculations. The surgery will be performed in order to improve vision for driving, reading, and for eye examinations. Recommend phacoemulsification with intra-ocular lens. Left Eye. Surgery required to correct imbalance of vision. Dilates well - shugarcaine by protocol. 2.  Symptomatic and visually significant. Accounts for patient's symptoms and is negatively affecting the patient's quality of life. Reviewed risks of the Yag laser including IOP spike, retinal detachment, and chronic inflammation. Patient wishes to proceed. Schedule patient for YAG capsulotomy 3.  PCO as above.

## 2020-04-10 NOTE — Patient Instructions (Addendum)
Your procedure is scheduled on:  04/14/2020               Report to [facility] at    8:20 AM.  Call this number if you have problems the morning of surgery: [phone]   Do not eat or drink :After Midnight.    Take these medicines the morning of surgery with A SIP OF WATER:         Zetia and lisinopril   Do not wear jewelry, make-up or nail polish.  Do not wear lotions, powders, or perfumes. You may wear deodorant.  Do not bring valuables to the hospital.  Contacts, dentures or bridgework may not be worn into surgery.  Patients discharged the day of surgery will not be allowed to drive home.  Name and phone number of your driver.                                                                                                                                       Cataract Surgery  A cataract is a clouding of the lens of the eye. When a lens becomes cloudy, vision is reduced based on the degree and nature of the clouding. Surgery may be needed to improve vision. Surgery removes the cloudy lens and usually replaces it with a substitute lens (intraocular lens, IOL). LET YOUR EYE DOCTOR KNOW ABOUT:  Allergies to food or medicine.   Medicines taken including herbs, eyedrops, over-the-counter medicines, and creams.   Use of steroids (by mouth or creams).   Previous problems with anesthetics or numbing medicine.   History of bleeding problems or blood clots.   Previous surgery.   Other health problems, including diabetes and kidney problems.   Possibility of pregnancy, if this applies.  RISKS AND COMPLICATIONS  Infection.   Inflammation of the eyeball (endophthalmitis) that can spread to both eyes (sympathetic ophthalmia).   Poor wound healing.   If an IOL is inserted, it can later fall out of proper position. This is very uncommon.   Clouding of the part of your eye that holds an IOL in place. This is called an "after-cataract." These are uncommon, but easily treated.  BEFORE  THE PROCEDURE  Do not eat or drink anything except small amounts of water for 8 to 12 before your surgery, or as directed by your caregiver.    Unless you are told otherwise, continue any eyedrops you have been prescribed.   Talk to your primary caregiver about all other medicines that you take (both prescription and non-prescription). In some cases, you may need to stop or change medicines near the time of your surgery. This is most important if you are taking blood-thinning medicine. Do not stop medicines unless you are told to do so.   Arrange for someone to drive you to and from the procedure.   Do not put contact lenses in either eye on the  day of your surgery.  PROCEDURE There is more than one method for safely removing a cataract. Your doctor can explain the differences and help determine which is best for you. Phacoemulsification surgery is the most common form of cataract surgery.  An injection is given behind the eye or eyedrops are given to make this a painless procedure.   A small cut (incision) is made on the edge of the clear, dome-shaped surface that covers the front of the eye (cornea).   A tiny probe is painlessly inserted into the eye. This device gives off ultrasound waves that soften and break up the cloudy center of the lens. This makes it easier for the cloudy lens to be removed by suction.   An IOL may be implanted.   The normal lens of the eye is covered by a clear capsule. Part of that capsule is intentionally left in the eye to support the IOL.   Your surgeon may or may not use stitches to close the incision.  There are other forms of cataract surgery that require a larger incision and stiches to close the eye. This approach is taken in cases where the doctor feels that the cataract cannot be easily removed using phacoemulsification. AFTER THE PROCEDURE  When an IOL is implanted, it does not need care. It becomes a permanent part of your eye and cannot be seen  or felt.   Your doctor will schedule follow-up exams to check on your progress.   Review your other medicines with your doctor to see which can be resumed after surgery.   Use eyedrops or take medicine as prescribed by your doctor.  Document Released: 12/05/2011 Document Reviewed: 12/02/2011 [facility] Patient Information 2012 [place].  .Cataract Surgery Care After Refer to this sheet in the next few weeks. These instructions provide you with information on caring for yourself after your procedure. Your caregiver may also give you more specific instructions. Your treatment has been planned according to current medical practices, but problems sometimes occur. Call your caregiver if you have any problems or questions after your procedure.  HOME CARE INSTRUCTIONS   Avoid strenuous activities as directed by your caregiver.   Ask your caregiver when you can resume driving.   Use eyedrops or other medicines to help healing and control pressure inside your eye as directed by your caregiver.   Only take over-the-counter or prescription medicines for pain, discomfort, or fever as directed by your caregiver.   Do not to touch or rub your eyes.   You may be instructed to use a protective shield during the first few days and nights after surgery. If not, wear sunglasses to protect your eyes. This is to protect the eye from pressure or from being accidentally bumped.   Keep the area around your eye clean and dry. Avoid swimming or allowing water to hit you directly in the face while showering. Keep soap and shampoo out of your eyes.   Do not bend or lift heavy objects. Bending increases pressure in the eye. You can walk, climb stairs, and do light household chores.   Do not put a contact lens into the eye that had surgery until your caregiver says it is okay to do so.   Ask your doctor when you can return to work. This will depend on the kind of work that you do. If you work in a dusty  environment, you may be advised to wear protective eyewear for a period of time.   Ask  your caregiver when it will be safe to engage in sexual activity.   Continue with your regular eye exams as directed by your caregiver.  What to expect:  It is normal to feel itching and mild discomfort for a few days after cataract surgery. Some fluid discharge is also common, and your eye may be sensitive to light and touch.   After 1 to 2 days, even moderate discomfort should disappear. In most cases, healing will take about 6 weeks.   If you received an intraocular lens (IOL), you may notice that colors are very bright or have a blue tinge. Also, if you have been in bright sunlight, everything may appear reddish for a few hours. If you see these color tinges, it is because your lens is clear and no longer cloudy. Within a few months after receiving an IOL, these extra colors should go away. When you have healed, you will probably need new glasses.  SEEK MEDICAL CARE IF:   You have increased bruising around your eye.   You have discomfort not helped by medicine.  SEEK IMMEDIATE MEDICAL CARE IF:   You have a  fever.   You have a worsening or sudden vision loss.   You have redness, swelling, or increasing pain in the eye.   You have a thick discharge from the eye that had surgery.  MAKE SURE YOU:  Understand these instructions.   Will watch your condition.   Will get help right away if you are not doing well or get worse.  Document Released: 07/05/2005 Document Revised: 12/05/2011 Document Reviewed: 08/09/2011 [facility] Patient Information 2012 [place].    Monitored Anesthesia Care  Monitored anesthesia care is an anesthesia service for a medical procedure. Anesthesia is the loss of the ability to feel pain. It is produced by medications called anesthetics. It may affect a small area of your body (local anesthesia), a large area of your body (regional anesthesia), or your entire body  (general anesthesia). The need for monitored anesthesia care depends your procedure, your condition, and the potential need for regional or general anesthesia. It is often provided during procedures where:   General anesthesia may be needed if there are complications. This is because you need special care when you are under general anesthesia.    You will be under local or regional anesthesia. This is so that you are able to have higher levels of anesthesia if needed.    You will receive calming medications (sedatives). This is especially the case if sedatives are given to put you in a semi-conscious state of relaxation (deep sedation). This is because the amount of sedative needed to produce this state can be hard to predict. Too much of a sedative can produce general anesthesia. Monitored anesthesia care is performed by one or more caregivers who have special training in all types of anesthesia. You will need to meet with these caregivers before your procedure. During this meeting, they will ask you about your medical history. They will also give you instructions to follow. (For example, you will need to stop eating and drinking before your procedure. You may also need to stop or change medications you are taking.) During your procedure, your caregivers will stay with you. They will:   Watch your condition. This includes watching you blood pressure, breathing, and level of pain.    Diagnose and treat problems that occur.    Give medications if they are needed. These may include calming medications (sedatives) and anesthetics.  Make sure you are comfortable.   Having monitored anesthesia care does not necessarily mean that you will be under anesthesia. It does mean that your caregivers will be able to manage anesthesia if you need it or if it occurs. It also means that you will be able to have a different type of anesthesia than you are having if you need it. When your procedure is complete, your  caregivers will continue to watch your condition. They will make sure any medications wear off before you are allowed to go home.  Document Released: 09/11/2005 Document Revised: 04/12/2013 Document Reviewed: 01/27/2013 [facility] Patient Information 2014 [place], [place].    Monitored Anesthesia Care, Care After These instructions provide you with information about caring for yourself after your procedure. Your health care provider may also give you more specific instructions. Your treatment has been planned according to current medical practices, but problems sometimes occur. Call your health care provider if you have any problems or questions after your procedure. What can I expect after the procedure? After your procedure, you may:  Feel sleepy for several hours.  Feel clumsy and have poor balance for several hours.  Feel forgetful about what happened after the procedure.  Have poor judgment for several hours.  Feel nauseous or vomit.  Have a sore throat if you had a breathing tube during the procedure. Follow these instructions at home: For at least 24 hours after the procedure:      Have a responsible adult stay with you. It is important to have someone help care for you until you are awake and alert.  Rest as needed.  Do not: ? Participate in activities in which you could fall or become injured. ? Drive. ? Use heavy machinery. ? Drink alcohol. ? Take sleeping pills or medicines that cause drowsiness. ? Make important decisions or sign legal documents. ? Take care of children on your own. Eating and drinking  Follow the diet that is recommended by your health care provider.  If you vomit, drink water, juice, or soup when you can drink without vomiting.  Make sure you have little or no nausea before eating solid foods. General instructions  Take over-the-counter and prescription medicines only as told by your health care provider.  If you have sleep apnea, surgery  and certain medicines can increase your risk for breathing problems. Follow instructions from your health care provider about wearing your sleep device: ? Anytime you are sleeping, including during daytime naps. ? While taking prescription pain medicines, sleeping medicines, or medicines that make you drowsy.  If you smoke, do not smoke without supervision.  Keep all follow-up visits as told by your health care provider. This is important. Contact a health care provider if:  You keep feeling nauseous or you keep vomiting.  You feel light-headed.  You develop a rash.  You have a fever. Get help right away if:  You have trouble breathing. Summary  For several hours after your procedure, you may feel sleepy and have poor judgment.  Have a responsible adult stay with you for at least 24 hours or until you are awake and alert. This information is not intended to replace advice given to you by your health care provider. Make sure you discuss any questions you have with your health care provider. Document Revised: 03/16/2018 Document Reviewed: 04/07/2016 Elsevier Patient Education  [place].

## 2020-04-11 ENCOUNTER — Other Ambulatory Visit (HOSPITAL_COMMUNITY)
Admission: RE | Admit: 2020-04-11 | Discharge: 2020-04-11 | Disposition: A | Discharge status: Home / Self Care | Payer: Medicare Other | Source: Ambulatory Visit | Attending: Ophthalmology | Admitting: Ophthalmology

## 2020-04-11 ENCOUNTER — Other Ambulatory Visit: Payer: Self-pay

## 2020-04-11 DIAGNOSIS — Z01812 Encounter for preprocedural laboratory examination: Secondary | ICD-10-CM | POA: Insufficient documentation

## 2020-04-11 DIAGNOSIS — Z20822 Contact with and (suspected) exposure to covid-19: Secondary | ICD-10-CM | POA: Diagnosis not present

## 2020-04-12 ENCOUNTER — Other Ambulatory Visit (HOSPITAL_COMMUNITY): Payer: Medicare Other

## 2020-04-12 ENCOUNTER — Other Ambulatory Visit: Payer: Self-pay

## 2020-04-12 ENCOUNTER — Encounter (HOSPITAL_COMMUNITY): Payer: Self-pay

## 2020-04-12 ENCOUNTER — Encounter (HOSPITAL_COMMUNITY)
Admission: RE | Admit: 2020-04-12 | Discharge: 2020-04-12 | Disposition: A | Discharge status: Home / Self Care | Payer: Medicare Other | Source: Ambulatory Visit | Attending: Ophthalmology | Admitting: Ophthalmology

## 2020-04-12 DIAGNOSIS — Z01818 Encounter for other preprocedural examination: Secondary | ICD-10-CM | POA: Diagnosis present

## 2020-04-12 LAB — BASIC METABOLIC PANEL
Anion gap: 10 (ref 5–15)
BUN: 21 mg/dL (ref 8–23)
CO2: 27 mmol/L (ref 22–32)
Calcium: 10.2 mg/dL (ref 8.9–10.3)
Chloride: 102 mmol/L (ref 98–111)
Creatinine, Ser: 0.96 mg/dL (ref 0.44–1.00)
GFR calc Af Amer: >60 mL/min (ref >60)
GFR calc non Af Amer: >60 mL/min (ref >60)
Glucose, Bld: 86 mg/dL (ref 70–99)
Potassium: 4.3 mmol/L (ref 3.5–5.1)
Sodium: 139 mmol/L (ref 135–145)

## 2020-04-12 LAB — CBC WITH DIFFERENTIAL/PLATELET
Abs Immature Granulocytes: 0.01 10*3/uL (ref 0.00–0.07)
Basophils Absolute: 0.1 10*3/uL (ref 0.0–0.1)
Basophils Relative: 2 %
Eosinophils Absolute: 0.2 10*3/uL (ref 0.0–0.5)
Eosinophils Relative: 4 %
HCT: 43.2 % (ref 36.0–46.0)
Hemoglobin: 13.4 g/dL (ref 12.0–15.0)
Immature Granulocytes: 0 %
Lymphocytes Relative: 48 %
Lymphs Abs: 1.9 10*3/uL (ref 0.7–4.0)
MCH: 25.2 pg — ABNORMAL LOW (ref 26.0–34.0)
MCHC: 31 g/dL (ref 30.0–36.0)
MCV: 81.4 fL (ref 80.0–100.0)
Monocytes Absolute: 0.4 10*3/uL (ref 0.1–1.0)
Monocytes Relative: 10 %
Neutro Abs: 1.4 10*3/uL — ABNORMAL LOW (ref 1.7–7.7)
Neutrophils Relative %: 36 %
Platelets: 318 10*3/uL (ref 150–400)
RBC: 5.31 MIL/uL — ABNORMAL HIGH (ref 3.87–5.11)
RDW: 14.8 % (ref 11.5–15.5)
WBC: 3.9 10*3/uL — ABNORMAL LOW (ref 4.0–10.5)
nRBC: 0 % (ref 0.0–0.2)

## 2020-04-12 LAB — SARS CORONAVIRUS 2 (TAT 6-24 HRS): SARS Coronavirus 2: NEGATIVE

## 2020-04-14 ENCOUNTER — Ambulatory Visit (HOSPITAL_COMMUNITY): Payer: Medicare Other | Admitting: Anesthesiology

## 2020-04-14 ENCOUNTER — Ambulatory Visit (HOSPITAL_COMMUNITY)
Admission: RE | Admit: 2020-04-14 | Discharge: 2020-04-14 | Disposition: A | Discharge status: Home / Self Care | Payer: Medicare Other | Attending: Ophthalmology | Admitting: Ophthalmology

## 2020-04-14 ENCOUNTER — Encounter (HOSPITAL_COMMUNITY): Payer: Self-pay | Admitting: Ophthalmology

## 2020-04-14 ENCOUNTER — Encounter (HOSPITAL_COMMUNITY)
Admission: RE | Disposition: A | Discharge status: Home / Self Care | Payer: Self-pay | Source: Home / Self Care | Attending: Ophthalmology

## 2020-04-14 DIAGNOSIS — Z961 Presence of intraocular lens: Secondary | ICD-10-CM | POA: Insufficient documentation

## 2020-04-14 DIAGNOSIS — H25812 Combined forms of age-related cataract, left eye: Secondary | ICD-10-CM | POA: Diagnosis not present

## 2020-04-14 DIAGNOSIS — I10 Essential (primary) hypertension: Secondary | ICD-10-CM | POA: Diagnosis not present

## 2020-04-14 HISTORY — PX: CATARACT EXTRACTION W/PHACO: SHX586

## 2020-04-14 SURGERY — PHACOEMULSIFICATION, CATARACT, WITH IOL INSERTION
Anesthesia: Monitor Anesthesia Care | Site: Eye | Laterality: Left

## 2020-04-14 MED ORDER — MIDAZOLAM HCL 2 MG/2ML IJ SOLN
INTRAMUSCULAR | Status: DC | PRN
  Administered 2020-04-14: 1 mg via INTRAVENOUS

## 2020-04-14 MED ORDER — TETRACAINE HCL 0.5 % OP SOLN
1.0000 [drp] | OPHTHALMIC | Status: AC | PRN
  Administered 2020-04-14 (×3): 1 [drp] via OPHTHALMIC

## 2020-04-14 MED ORDER — PROVISC 10 MG/ML IO SOLN
INTRAOCULAR | Status: DC | PRN
  Administered 2020-04-14: 0.85 mL via INTRAOCULAR

## 2020-04-14 MED ORDER — BSS IO SOLN
INTRAOCULAR | Status: DC | PRN
  Administered 2020-04-14: 15 mL

## 2020-04-14 MED ORDER — LIDOCAINE HCL 3.5 % OP GEL
1.0000 "application " | Freq: Once | OPHTHALMIC | Status: AC
  Administered 2020-04-14: 1 via OPHTHALMIC

## 2020-04-14 MED ORDER — NEOMYCIN-POLYMYXIN-DEXAMETH 3.5-10000-0.1 OP SUSP
OPHTHALMIC | Status: DC | PRN
  Administered 2020-04-14: 1 [drp] via OPHTHALMIC

## 2020-04-14 MED ORDER — SODIUM HYALURONATE 23 MG/ML IO SOLN
INTRAOCULAR | Status: DC | PRN
  Administered 2020-04-14: 0.6 mL via INTRAOCULAR

## 2020-04-14 MED ORDER — EPINEPHRINE PF 1 MG/ML IJ SOLN
INTRAMUSCULAR | Status: AC
  Filled 2020-04-14: qty 2

## 2020-04-14 MED ORDER — PHENYLEPHRINE HCL 2.5 % OP SOLN
1.0000 [drp] | OPHTHALMIC | Status: AC | PRN
  Administered 2020-04-14 (×3): 1 [drp] via OPHTHALMIC

## 2020-04-14 MED ORDER — LIDOCAINE HCL (PF) 1 % IJ SOLN
INTRAOCULAR | Status: DC | PRN
  Administered 2020-04-14: 10:00:00 1 mL via OPHTHALMIC

## 2020-04-14 MED ORDER — POVIDONE-IODINE 5 % OP SOLN
OPHTHALMIC | Status: DC | PRN
  Administered 2020-04-14: 1 via OPHTHALMIC

## 2020-04-14 MED ORDER — CYCLOPENTOLATE-PHENYLEPHRINE 0.2-1 % OP SOLN
1.0000 [drp] | OPHTHALMIC | Status: AC | PRN
  Administered 2020-04-14 (×3): 1 [drp] via OPHTHALMIC

## 2020-04-14 MED ORDER — EPINEPHRINE PF 1 MG/ML IJ SOLN
INTRAOCULAR | Status: DC | PRN
  Administered 2020-04-14: 500 mL

## 2020-04-14 MED ORDER — MIDAZOLAM HCL 2 MG/2ML IJ SOLN
INTRAMUSCULAR | Status: AC
  Filled 2020-04-14: qty 2

## 2020-04-14 SURGICAL SUPPLY — 14 items
CLOTH BEACON ORANGE TIMEOUT ST (SAFETY) ×3 IMPLANT
EYE SHIELD UNIVERSAL CLEAR (GAUZE/BANDAGES/DRESSINGS) ×3 IMPLANT
GLOVE BIOGEL PI IND STRL 6.5 (GLOVE) ×1 IMPLANT
GLOVE BIOGEL PI IND STRL 7.0 (GLOVE) ×1 IMPLANT
GLOVE BIOGEL PI INDICATOR 6.5 (GLOVE) ×2
GLOVE BIOGEL PI INDICATOR 7.0 (GLOVE) ×2
LENS ALC ACRYL/TECN (Ophthalmic Related) ×3 IMPLANT
NEEDLE HYPO 18GX1.5 BLUNT FILL (NEEDLE) ×3 IMPLANT
PAD ARMBOARD 7.5X6 YLW CONV (MISCELLANEOUS) ×3 IMPLANT
SYR TB 1ML LL NO SAFETY (SYRINGE) ×3 IMPLANT
TAPE SURG TRANSPORE 1 IN (GAUZE/BANDAGES/DRESSINGS) ×1 IMPLANT
TAPE SURGICAL TRANSPORE 1 IN (GAUZE/BANDAGES/DRESSINGS) ×2
VISCOELASTIC ADDITIONAL (OPHTHALMIC RELATED) ×3 IMPLANT
WATER STERILE IRR 250ML POUR (IV SOLUTION) ×3 IMPLANT

## 2020-04-14 NOTE — Anesthesia Preprocedure Evaluation (Signed)
Anesthesia Evaluation  Patient identified by MRN, date of birth, ID band Patient awake    Reviewed: Allergy & Precautions, NPO status , Patient's Chart, lab work & pertinent test results  Airway Mallampati: II  TM Distance: >3 FB Neck ROM: Full    Dental  (+) Missing   Pulmonary neg pulmonary ROS,    breath sounds clear to auscultation       Cardiovascular Exercise Tolerance: Good hypertension, Pt. on medications  Rhythm:Regular Rate:Normal     Neuro/Psych negative neurological ROS  negative psych ROS   GI/Hepatic negative GI ROS, Neg liver ROS,   Endo/Other  negative endocrine ROS  Renal/GU negative Renal ROS  negative genitourinary   Musculoskeletal negative musculoskeletal ROS (+)   Abdominal   Peds  Hematology negative hematology ROS (+)   Anesthesia Other Findings   Reproductive/Obstetrics negative OB ROS                             Anesthesia Physical Anesthesia Plan  ASA: II  Anesthesia Plan: MAC   Post-op Pain Management:    Induction:   PONV Risk Score and Plan: Treatment may vary due to age or medical condition  Airway Management Planned: Nasal Cannula and Natural Airway  Additional Equipment:   Intra-op Plan:   Post-operative Plan:   Informed Consent: I have reviewed the patients History and Physical, chart, labs and discussed the procedure including the risks, benefits and alternatives for the proposed anesthesia with the patient or authorized representative who has indicated his/her understanding and acceptance.     Dental advisory given  Plan Discussed with: CRNA  Anesthesia Plan Comments:         Anesthesia Quick Evaluation

## 2020-04-14 NOTE — Anesthesia Postprocedure Evaluation (Signed)
Anesthesia Post Note  Patient: Laura Johns  Procedure(s) Performed: CATARACT EXTRACTION PHACO AND INTRAOCULAR LENS PLACEMENT LEFT EYE CDE=6.73 (Left Eye)  Patient location during evaluation: Short Stay Anesthesia Type: MAC Level of consciousness: awake and alert Pain management: pain level controlled Vital Signs Assessment: post-procedure vital signs reviewed and stable Respiratory status: spontaneous breathing Cardiovascular status: stable Postop Assessment: no apparent nausea or vomiting Anesthetic complications: no     Last Vitals:  Vitals:   04/14/20 0847  BP: (!) 155/86  Pulse: (!) 55  Resp: 20  Temp: 36.8 C  SpO2: 99%    Last Pain:  Vitals:   04/14/20 0847  TempSrc: Oral  PainSc: 0-No pain                 Everette Rank

## 2020-04-14 NOTE — Transfer of Care (Signed)
Immediate Anesthesia Transfer of Care Note  Patient: Laura Johns  Procedure(s) Performed: CATARACT EXTRACTION PHACO AND INTRAOCULAR LENS PLACEMENT LEFT EYE CDE=6.73 (Left Eye)  Patient Location: Short Stay  Anesthesia Type:MAC  Level of Consciousness: awake, alert , oriented and patient cooperative  Airway & Oxygen Therapy: Patient Spontanous Breathing  Post-op Assessment: Report given to RN and Post -op Vital signs reviewed and stable  Post vital signs: Reviewed and stable  Last Vitals:  Vitals Value Taken Time  BP    Temp    Pulse    Resp    SpO2      Last Pain:  Vitals:   04/14/20 0847  TempSrc: Oral  PainSc: 0-No pain      Patients Stated Pain Goal: 8 ([id number])  Complications: No apparent anesthesia complications

## 2020-04-14 NOTE — Discharge Instructions (Signed)
Please discharge patient when stable, will follow up today with Dr. Shivaay Stormont at the [facility] office immediately following discharge.  Leave shield in place until visit.  All paperwork with discharge instructions will be given at the office.  [facility] Address:  [address]  [place], [place] [address]             Monitored Anesthesia Care, Care After These instructions provide you with information about caring for yourself after your procedure. Your health care provider may also give you more specific instructions. Your treatment has been planned according to current medical practices, but problems sometimes occur. Call your health care provider if you have any problems or questions after your procedure. What can I expect after the procedure? After your procedure, you may:  Feel sleepy for several hours.  Feel clumsy and have poor balance for several hours.  Feel forgetful about what happened after the procedure.  Have poor judgment for several hours.  Feel nauseous or vomit.  Have a sore throat if you had a breathing tube during the procedure. Follow these instructions at home: For at least 24 hours after the procedure:      Have a responsible adult stay with you. It is important to have someone help care for you until you are awake and alert.  Rest as needed.  Do not: ? Participate in activities in which you could fall or become injured. ? Drive. ? Use heavy machinery. ? Drink alcohol. ? Take sleeping pills or medicines that cause drowsiness. ? Make important decisions or sign legal documents. ? Take care of children on your own. Eating and drinking  Follow the diet that is recommended by your health care provider.  If you vomit, drink water, juice, or soup when you can drink without vomiting.  Make sure you have little or no nausea before eating solid foods. General instructions  Take over-the-counter and  prescription medicines only as told by your health care provider.  If you have sleep apnea, surgery and certain medicines can increase your risk for breathing problems. Follow instructions from your health care provider about wearing your sleep device: ? Anytime you are sleeping, including during daytime naps. ? While taking prescription pain medicines, sleeping medicines, or medicines that make you drowsy.  If you smoke, do not smoke without supervision.  Keep all follow-up visits as told by your health care provider. This is important. Contact a health care provider if:  You keep feeling nauseous or you keep vomiting.  You feel light-headed.  You develop a rash.  You have a fever. Get help right away if:  You have trouble breathing. Summary  For several hours after your procedure, you may feel sleepy and have poor judgment.  Have a responsible adult stay with you for at least 24 hours or until you are awake and alert. This information is not intended to replace advice given to you by your health care provider. Make sure you discuss any questions you have with your health care provider. Document Revised: 03/16/2018 Document Reviewed: 04/07/2016 Elsevier Patient Education  2020 Elsevier Inc.  

## 2020-04-14 NOTE — Op Note (Signed)
Date of procedure: 04/14/20  Pre-operative diagnosis: Visually significant age-related combined cataract, Left Eye (H25.812)  Post-operative diagnosis: Visually significant age-related combined cataract, Left Eye (H25.812)  Procedure: Removal of cataract via phacoemulsification and insertion of intra-ocular lens [name] and [name]  +20.5D into the capsular bag of the Left Eye  Attending surgeon: Gerda Diss. Torianne Laflam, MD, MA  Anesthesia: MAC, Topical Akten  Complications: None  Estimated Blood Loss: <89m (minimal)  Specimens: None  Implants: As above  Indications:  Visually significant age-related cataract, Left Eye  Procedure:  The patient was seen and identified in the pre-operative area. The operative eye was identified and dilated.  The operative eye was marked.  Topical anesthesia was administered to the operative eye.     The patient was then to the operative suite and placed in the supine position.  A timeout was performed confirming the patient, procedure to be performed, and all other relevant information.   The patient's face was prepped and draped in the usual fashion for intra-ocular surgery.  A lid speculum was placed into the operative eye and the surgical microscope moved into place and focused.  An inferotemporal paracentesis was created using a 20 gauge paracentesis blade.  Shugarcaine was injected into the anterior chamber.  Viscoelastic was injected into the anterior chamber.  A temporal clear-corneal main wound incision was created using a 2.448mmicrokeratome.  A continuous curvilinear capsulorrhexis was initiated using an irrigating cystitome and completed using capsulorrhexis forceps.  Hydrodissection and hydrodeliniation were performed.  Viscoelastic was injected into the anterior chamber.  A phacoemulsification handpiece and a chopper as a second instrument were used to remove the nucleus and epinucleus. The irrigation/aspiration handpiece was used to remove  any remaining cortical material.   The capsular bag was reinflated with viscoelastic, checked, and found to be intact.  The intraocular lens was inserted into the capsular bag.  The irrigation/aspiration handpiece was used to remove any remaining viscoelastic.  The clear corneal wound and paracentesis wounds were then hydrated and checked with Weck-Cels to be watertight.  The lid-speculum was removed.  The drape was removed.  The patient's face was cleaned with a wet and dry 4x4.   Maxitrol was instilled in the eye before a clear shield was taped over the eye. The patient was taken to the post-operative care unit in good condition, having tolerated the procedure well.  Post-Op Instructions: [facility] and will receive all other orders and instructions.

## 2020-04-14 NOTE — Interval H&P Note (Signed)
History and Physical Interval Note: The H and P was reviewed and updated. The patient was examined.  No changes were found after exam.  The surgical eye was marked.  04/14/2020 9:49 AM  Laura Johns  has presented today for surgery, with the diagnosis of Nuclear sclerotic cataract - Left eye.  The various methods of treatment have been discussed with the patient and family. After consideration of risks, benefits and other options for treatment, the patient has consented to  Procedure(s) with comments: CATARACT EXTRACTION PHACO AND INTRAOCULAR LENS PLACEMENT LEFT EYE (Left) - left as a surgical intervention.  The patient's history has been reviewed, patient examined, no change in status, stable for surgery.  I have reviewed the patient's chart and labs.  Questions were answered to the patient's satisfaction.     Baruch Goldmann

## 2020-07-25 ENCOUNTER — Other Ambulatory Visit: Payer: Self-pay | Admitting: Obstetrics and Gynecology

## 2020-07-25 DIAGNOSIS — Z1231 Encounter for screening mammogram for malignant neoplasm of breast: Secondary | ICD-10-CM

## 2020-08-25 ENCOUNTER — Other Ambulatory Visit: Payer: Self-pay

## 2020-08-25 ENCOUNTER — Ambulatory Visit
Admission: RE | Admit: 2020-08-25 | Discharge: 2020-08-25 | Disposition: A | Discharge status: Home / Self Care | Payer: Medicare Other | Source: Ambulatory Visit | Attending: Obstetrics and Gynecology | Admitting: Obstetrics and Gynecology

## 2020-08-25 DIAGNOSIS — Z1231 Encounter for screening mammogram for malignant neoplasm of breast: Secondary | ICD-10-CM | POA: Diagnosis not present

## 2020-09-01 ENCOUNTER — Other Ambulatory Visit: Payer: Self-pay | Admitting: Obstetrics and Gynecology

## 2020-09-01 DIAGNOSIS — R928 Other abnormal and inconclusive findings on diagnostic imaging of breast: Secondary | ICD-10-CM

## 2020-09-01 DIAGNOSIS — N6489 Other specified disorders of breast: Secondary | ICD-10-CM

## 2020-09-14 ENCOUNTER — Ambulatory Visit
Admission: RE | Admit: 2020-09-14 | Discharge: 2020-09-14 | Disposition: A | Discharge status: Home / Self Care | Payer: Medicare Other | Source: Ambulatory Visit | Attending: Obstetrics and Gynecology | Admitting: Obstetrics and Gynecology

## 2020-09-14 ENCOUNTER — Other Ambulatory Visit: Payer: Self-pay

## 2020-09-14 DIAGNOSIS — R928 Other abnormal and inconclusive findings on diagnostic imaging of breast: Secondary | ICD-10-CM

## 2020-09-14 DIAGNOSIS — N6489 Other specified disorders of breast: Secondary | ICD-10-CM | POA: Insufficient documentation

## 2020-09-21 ENCOUNTER — Other Ambulatory Visit: Payer: Self-pay | Admitting: Obstetrics and Gynecology

## 2020-09-21 DIAGNOSIS — N6310 Unspecified lump in the right breast, unspecified quadrant: Secondary | ICD-10-CM

## 2020-09-21 DIAGNOSIS — R928 Other abnormal and inconclusive findings on diagnostic imaging of breast: Secondary | ICD-10-CM

## 2020-09-27 ENCOUNTER — Ambulatory Visit
Admission: RE | Admit: 2020-09-27 | Discharge: 2020-09-27 | Disposition: A | Discharge status: Home / Self Care | Payer: Medicare Other | Source: Ambulatory Visit | Attending: Obstetrics and Gynecology | Admitting: Obstetrics and Gynecology

## 2020-09-27 DIAGNOSIS — N6310 Unspecified lump in the right breast, unspecified quadrant: Secondary | ICD-10-CM | POA: Diagnosis present

## 2020-09-27 DIAGNOSIS — R928 Other abnormal and inconclusive findings on diagnostic imaging of breast: Secondary | ICD-10-CM | POA: Insufficient documentation

## 2020-09-27 HISTORY — PX: BREAST BIOPSY: SHX20

## 2020-09-29 ENCOUNTER — Other Ambulatory Visit: Payer: Self-pay

## 2020-09-29 DIAGNOSIS — C50919 Malignant neoplasm of unspecified site of unspecified female breast: Secondary | ICD-10-CM

## 2020-10-02 NOTE — Progress Notes (Signed)
Initiated navigation on 09/29/20.  Scheduled consults with Dr. Bary Castilla and Dr. Rogue Bussing per patient request.

## 2020-10-03 LAB — SURGICAL PATHOLOGY

## 2020-10-05 ENCOUNTER — Ambulatory Visit: Payer: Medicare Other | Admitting: Oncology

## 2020-10-05 ENCOUNTER — Other Ambulatory Visit: Payer: Medicare Other

## 2020-10-06 ENCOUNTER — Other Ambulatory Visit: Payer: Self-pay

## 2020-10-06 ENCOUNTER — Inpatient Hospital Stay: Payer: Medicare Other

## 2020-10-06 ENCOUNTER — Encounter: Payer: Self-pay | Admitting: Internal Medicine

## 2020-10-06 ENCOUNTER — Inpatient Hospital Stay: Payer: Medicare Other | Attending: Internal Medicine | Admitting: Internal Medicine

## 2020-10-06 DIAGNOSIS — I10 Essential (primary) hypertension: Secondary | ICD-10-CM | POA: Diagnosis not present

## 2020-10-06 DIAGNOSIS — E119 Type 2 diabetes mellitus without complications: Secondary | ICD-10-CM | POA: Insufficient documentation

## 2020-10-06 DIAGNOSIS — Z7982 Long term (current) use of aspirin: Secondary | ICD-10-CM | POA: Diagnosis not present

## 2020-10-06 DIAGNOSIS — C50411 Malignant neoplasm of upper-outer quadrant of right female breast: Secondary | ICD-10-CM | POA: Diagnosis not present

## 2020-10-06 DIAGNOSIS — E785 Hyperlipidemia, unspecified: Secondary | ICD-10-CM | POA: Diagnosis not present

## 2020-10-06 DIAGNOSIS — Z170 Estrogen receptor positive status [ER+]: Secondary | ICD-10-CM | POA: Diagnosis not present

## 2020-10-06 DIAGNOSIS — Z79899 Other long term (current) drug therapy: Secondary | ICD-10-CM | POA: Diagnosis not present

## 2020-10-06 NOTE — Progress Notes (Signed)
Phoned patient post Visit with Dr. Rogue Bussing.  She verbalized understanding of  treatment plan, and will call back with surgery date.

## 2020-10-06 NOTE — Assessment & Plan Note (Addendum)
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  I reviewed the pathology and stage with the patient and family in detail.   #  I had a long discussion with the patient in general regarding the treatment options of breast cancer including-surgery; adjuvant radiation; role of adjuvant systemic therapy including-chemotherapy antihormone.   # Patient will need lumpectomy with sentinel lymph node evaluation; followed by radiation.  Clinically less likely to need chemotherapy based on current available information.  However final recommendations regarding chemotherapy based on on final surgical pathology/gene assay.  Clinically less likely patient will need systemic chemotherapy.  Patient will benefit from antihormone therapy.  I discussed the potential benefits of each option; and also potential downsides in detail.   Thank you Dr.Linthavong for allowing me to participate in the care of your pleasant patient. Please do not hesitate to contact me with questions or concerns in the interim.  Discussed with Dr. Tollie Pizza.  # DISPOSITION: # follow up TBD- Dr.B

## 2020-10-06 NOTE — Progress Notes (Signed)
[facility] NOTE  Patient Care Team: [name], MD as PCP - General (Family Medicine) Theodore Demark, RN as Oncology Nurse Navigator (Oncology)  CHIEF COMPLAINTS/PURPOSE OF CONSULTATION: Breast cancer  #  Oncology History Overview Note  # RIGHT BREAST IMC: ER/PR-POSITIVE; her 2 NEG.   A. BREAST, RIGHT AT 9:00, 2 CM FROM THE NIPPLE; ULTRASOUND-GUIDED CORE  NEEDLE BIOPSY:  - INVASIVE MAMMARY CARCINOMA, NO SPECIAL TYPE.   Size of invasive carcinoma: 5 mm in this sample  Histologic grade of invasive carcinoma: Grade 1DIAGNOSIS:  A. BREAST, RIGHT AT 9:00, 2 CM FROM THE NIPPLE; ULTRASOUND-GUIDED CORE  NEEDLE BIOPSY:  - INVASIVE MAMMARY CARCINOMA, NO SPECIAL TYPE.   Size of invasive carcinoma: 5 mm in this sample  Histologic grade of invasive carcinoma: Grade 1  # SURVIVORSHIP:   # GENETICS:   DIAGNOSIS:   STAGE:         ;  GOALS:  CURRENT/MOST RECENT THERAPY :     Carcinoma of upper-outer quadrant of right breast in female, estrogen receptor positive ([place])  10/06/2020 Initial Diagnosis   Carcinoma of upper-outer quadrant of right breast in female, estrogen receptor positive ([place])    HISTORY OF PRESENTING ILLNESS:  Laura Johns 69 y.o.  female female with no prior history of breast cancer/or malignancies has been referred to [place] for further evaluation recommendations for new diagnosis of breast cancer.   Patient states she was found to have an abnormal screening mammogram which led to diagnostic mammogram/ultrasound/followed by biopsy-as summarized above.  Family history of breast cancer: none  Family history of other cancers: grand mother colon cancer Menarche: year old : 84 years Menopause: years old: TAH; ovaries intact Used OCP: none Used estrogen and progesterone therapy:none  History of Radiation to the chest:none  Previous biopsy: none   Review of Systems  Constitutional: Negative for chills, diaphoresis, fever, malaise/fatigue  and weight loss.  HENT: Negative for nosebleeds and sore throat.   Eyes: Negative for double vision.  Respiratory: Negative for cough, hemoptysis, sputum production, shortness of breath and wheezing.   Cardiovascular: Negative for chest pain, palpitations, orthopnea and leg swelling.  Gastrointestinal: Negative for abdominal pain, blood in stool, constipation, diarrhea, heartburn, melena, nausea and vomiting.  Genitourinary: Negative for dysuria, frequency and urgency.  Musculoskeletal: Positive for back pain and joint pain.  Skin: Negative.  Negative for itching and rash.  Neurological: Negative for dizziness, tingling, focal weakness, weakness and headaches.  Endo/Heme/Allergies: Does not bruise/bleed easily.  Psychiatric/Behavioral: Negative for depression. The patient is not nervous/anxious and does not have insomnia.      MEDICAL HISTORY:  Past Medical History:  Diagnosis Date  . Borderline diabetes mellitus   . Diverticulitis   . Hyperlipemia   . Hypertension     SURGICAL HISTORY: Past Surgical History:  Procedure Laterality Date  . ABDOMINAL HYSTERECTOMY    . CATARACT EXTRACTION W/PHACO Right 08/25/2014   Procedure: CATARACT EXTRACTION PHACO AND INTRAOCULAR LENS PLACEMENT (IOC);  Surgeon: Tonny Branch, MD;  Location: [facility];  Service: Ophthalmology;  Laterality: Right;  CDE 5.82  . CATARACT EXTRACTION W/PHACO Left 04/14/2020   Procedure: CATARACT EXTRACTION PHACO AND INTRAOCULAR LENS PLACEMENT LEFT EYE CDE=6.73;  Surgeon: Baruch Goldmann, MD;  Location: [facility];  Service: Ophthalmology;  Laterality: Left;  left  . COLONOSCOPY WITH PROPOFOL N/A 09/05/2017   Procedure: COLONOSCOPY WITH PROPOFOL;  Surgeon: Manya Silvas, MD;  Location: [facility] ENDOSCOPY;  Service: Endoscopy;  Laterality: N/A;  . ESOPHAGOGASTRODUODENOSCOPY (EGD) WITH  PROPOFOL N/A 09/05/2017   Procedure: ESOPHAGOGASTRODUODENOSCOPY (EGD) WITH PROPOFOL;  Surgeon: Manya Silvas, MD;  Location: [facility] ENDOSCOPY;  Service:  Endoscopy;  Laterality: N/A;    SOCIAL HISTORY: Social History   Socioeconomic History  . Marital status: Married    Spouse name: Not on file  . Number of children: Not on file  . Years of education: Not on file  . Highest education level: Not on file  Occupational History  . Not on file  Tobacco Use  . Smoking status: Never Smoker  . Smokeless tobacco: Never Used  Vaping Use  . Vaping Use: Never used  Substance and Sexual Activity  . Alcohol use: No  . Drug use: No  . Sexual activity: Yes  Other Topics Concern  . Not on file  Social History Narrative   Never smoked; no alcohol; works in Scientist, research (medical). Lives in [place]; [place] address. With husband at home; one daughter- 72 y.    Social Determinants of Health   Financial Resource Strain:   . Difficulty of Paying Living Expenses: Not on file  Food Insecurity:   . Worried About Charity fundraiser in the Last Year: Not on file  . Ran Out of Food in the Last Year: Not on file  Transportation Needs:   . Lack of Transportation (Medical): Not on file  . Lack of Transportation (Non-Medical): Not on file  Physical Activity:   . Days of Exercise per Week: Not on file  . Minutes of Exercise per Session: Not on file  Stress:   . Feeling of Stress : Not on file  Social Connections:   . Frequency of Communication with Friends and Family: Not on file  . Frequency of Social Gatherings with Friends and Family: Not on file  . Attends Religious Services: Not on file  . Active Member of Clubs or Organizations: Not on file  . Attends Archivist Meetings: Not on file  . Marital Status: Not on file  Intimate Partner Violence:   . Fear of Current or Ex-Partner: Not on file  . Emotionally Abused: Not on file  . Physically Abused: Not on file  . Sexually Abused: Not on file    FAMILY HISTORY: Family History  Problem Relation Age of Onset  . Stroke Mother   . Heart attack Father   . Stroke Father    . Breast cancer Neg Hx     ALLERGIES:  is allergic to atorvastatin, lovastatin, pravastatin, contrast media [iodinated diagnostic agents], morphine and related, and nexium [esomeprazole magnesium].  MEDICATIONS:  Current Outpatient Medications  Medication Sig Dispense Refill  . aspirin EC 81 MG tablet Take 81 mg by mouth daily.    . cholecalciferol (VITAMIN D3) 25 MCG (1000 UNIT) tablet Take 1,000 Units by mouth daily.    Marland Kitchen ezetimibe (ZETIA) 10 MG tablet Take 10 mg by mouth daily.    . hydrochlorothiazide (HYDRODIURIL) 25 MG tablet Take 25 mg by mouth daily.    Marland Kitchen lisinopril (PRINIVIL,ZESTRIL) 10 MG tablet Take 20 mg by mouth daily.      No current facility-administered medications for this visit.      Marland Kitchen  PHYSICAL EXAMINATION: ECOG PERFORMANCE STATUS: 0 - Asymptomatic  Vitals:   10/06/20 1000  BP: (!) 150/86  Pulse: 73  Resp: 16  Temp: (!) 97.5 F (36.4 C)  SpO2: 100%   Filed Weights   10/06/20 1000  Weight: 128 lb (58.1 kg)    Physical Exam HENT:  Head: Normocephalic and atraumatic.     Mouth/Throat:     Pharynx: No oropharyngeal exudate.  Eyes:     Pupils: Pupils are equal, round, and reactive to light.  Cardiovascular:     Rate and Rhythm: Normal rate and regular rhythm.  Pulmonary:     Effort: No respiratory distress.     Breath sounds: No wheezing.  Abdominal:     General: Bowel sounds are normal. There is no distension.     Palpations: Abdomen is soft. There is no mass.     Tenderness: There is no abdominal tenderness. There is no guarding or rebound.  Musculoskeletal:        General: No tenderness. Normal range of motion.     Cervical back: Normal range of motion and neck supple.  Skin:    General: Skin is warm.  Neurological:     Mental Status: She is alert and oriented to person, place, and time.  Psychiatric:        Mood and Affect: Affect normal.      LABORATORY DATA:  I have reviewed the data as listed Lab Results  Component Value  Date   WBC 3.9 (L) 04/12/2020   HGB 13.4 04/12/2020   HCT 43.2 04/12/2020   MCV 81.4 04/12/2020   PLT 318 04/12/2020   Recent Labs    04/12/20 1002  NA 139  K 4.3  CL 102  CO2 27  GLUCOSE 86  BUN 21  CREATININE 0.96  CALCIUM 10.2  GFRNONAA >60  GFRAA >60    RADIOGRAPHIC STUDIES: I have personally reviewed the radiological images as listed and agreed with the findings in the report. US BREAST LTD UNI RIGHT INC AXILLA  Result Date: 09/14/2020 CLINICAL DATA:  Screening recall for possible right breast asymmetry. EXAM: DIGITAL DIAGNOSTIC UNILATERAL RIGHT MAMMOGRAM WITH TOMO AND CAD; ULTRASOUND RIGHT BREAST LIMITED COMPARISON:  Previous exams. ACR Breast Density Category b: There are scattered areas of fibroglandular density. FINDINGS: The initially questioned possible right breast asymmetry persists, although is not well seen on the spot compression MLO tomograms. Mammographic images were processed with CAD. Targeted ultrasound of the outer right breast was performed. There is an irregular mildly hypoechoic mass in the right breast at 9 o'clock 2 cm from nipple measuring 0.5 x 0.3 x 0.5 cm. This is felt to correspond well with the asymmetry seen in the right breast at mammography. No lymphadenopathy seen in the right axilla. IMPRESSION: Suspicious 0.5 cm right breast mass. RECOMMENDATION: Recommend ultrasound-guided biopsy of the mass in the right breast at the 9 o'clock position. I have discussed the findings and recommendations with the patient. If applicable, a reminder letter will be sent to the patient regarding the next appointment. BI-RADS CATEGORY  4: Suspicious. Electronically Signed   By: Everlean Alstrom M.D.   On: 09/14/2020 10:39   MM DIAG BREAST TOMO UNI RIGHT  Result Date: 09/14/2020 CLINICAL DATA:  Screening recall for possible right breast asymmetry. EXAM: DIGITAL DIAGNOSTIC UNILATERAL RIGHT MAMMOGRAM WITH TOMO AND CAD; ULTRASOUND RIGHT BREAST LIMITED COMPARISON:  Previous  exams. ACR Breast Density Category b: There are scattered areas of fibroglandular density. FINDINGS: The initially questioned possible right breast asymmetry persists, although is not well seen on the spot compression MLO tomograms. Mammographic images were processed with CAD. Targeted ultrasound of the outer right breast was performed. There is an irregular mildly hypoechoic mass in the right breast at 9 o'clock 2 cm from nipple measuring 0.5 x 0.3 x 0.5 cm.  This is felt to correspond well with the asymmetry seen in the right breast at mammography. No lymphadenopathy seen in the right axilla. IMPRESSION: Suspicious 0.5 cm right breast mass. RECOMMENDATION: Recommend ultrasound-guided biopsy of the mass in the right breast at the 9 o'clock position. I have discussed the findings and recommendations with the patient. If applicable, a reminder letter will be sent to the patient regarding the next appointment. BI-RADS CATEGORY  4: Suspicious. Electronically Signed   By: Everlean Alstrom M.D.   On: 09/14/2020 10:39   MM CLIP PLACEMENT RIGHT  Result Date: 09/27/2020 CLINICAL DATA:  Status post ultrasound-guided core biopsy of mass in the RIGHT breast. EXAM: DIAGNOSTIC RIGHT MAMMOGRAM POST ULTRASOUND BIOPSY COMPARISON:  Previous exam(s). FINDINGS: Mammographic images were obtained following ultrasound guided biopsy of mass in the 9 o'clock location of the RIGHT breast and placement of an X shaped clip. The biopsy marking clip is in expected position at the site of biopsy. IMPRESSION: Appropriate positioning of the X shaped shaped biopsy marking clip at the site of biopsy in the LATERAL portion of the RIGHT breast. Final Assessment: Post Procedure Mammograms for Marker Placement Electronically Signed   By: Nolon Nations M.D.   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 This was found to be concordant by Dr. Nolon Nations. Pathology results were discussed with the patient by telephone. The patient reported doing well after the biopsy with tenderness at the site. Post biopsy instructions and care were reviewed and questions were answered. The patient was encouraged to call [facility] for any additional concerns. Results sent to [facility] via Epic messaging on September 29, 2020. The patient will be scheduled for Oncology and surgical referrals. Pathology results reported by Stacie Acres RN on 09/29/2020. Electronically Signed   By: Nolon Nations M.D.   On: 09/29/2020 14:25   Result Date: 09/29/2020 CLINICAL DATA:  Patient presents for ultrasound-guided biopsy of mass in the RIGHT breast. EXAM: ULTRASOUND GUIDED RIGHT BREAST CORE NEEDLE BIOPSY COMPARISON:  Previous exam(s). PROCEDURE: I met with the patient and we discussed the procedure of ultrasound-guided biopsy, including benefits and alternatives. We discussed the high likelihood of a successful procedure. We discussed the risks of the procedure, including infection, bleeding, tissue injury, clip migration, and inadequate sampling. Informed written consent was given. The usual time-out protocol was performed immediately prior to the procedure. Lesion quadrant: 1 o'clock location RIGHT breast, X shaped clip Using sterile technique and 1% Lidocaine as local anesthetic, under direct ultrasound visualization, a 14 gauge spring-loaded device was used to perform biopsy of mass in the 9 o'clock location of the RIGHT breast using a inferior to superior approach. At the conclusion of the procedure an x shaped tissue marker clip was deployed into the biopsy cavity. Follow up 2 view mammogram was performed and dictated separately. IMPRESSION: Ultrasound  guided biopsy of RIGHT breast mass. No apparent complications. Electronically Signed: By: Nolon Nations M.D. On: 09/27/2020 13:34    ASSESSMENT & PLAN:   Carcinoma of upper-outer quadrant of right breast in female, estrogen receptor positive ([place]) #Clinical stage I right breast cancer ER/PR positive HER-2 negative.  I reviewed the pathology and stage  with the patient and family in detail.   #  I had a long discussion with the patient in general regarding the treatment options of breast cancer including-surgery; adjuvant radiation; role of adjuvant systemic therapy including-chemotherapy antihormone.   # Patient will need lumpectomy with sentinel lymph node evaluation; followed by radiation.  Clinically less likely to need chemotherapy based on current available information.  However final recommendations regarding chemotherapy based on on final surgical pathology/gene assay.  Clinically less likely patient will need systemic chemotherapy.  Patient will benefit from antihormone therapy.  I discussed the potential benefits of each option; and also potential downsides in detail.   Thank you Dr.Linthavong for allowing me to participate in the care of your pleasant patient. Please do not hesitate to contact me with questions or concerns in the interim.  Discussed with Dr. Tollie Pizza.  # DISPOSITION: # follow up TBD- Dr.B  All questions were answered. The patient/family knows to call the clinic with any problems, questions or concerns.   Cammie Sickle, MD 10/11/2020 8:14 AM

## 2020-10-11 ENCOUNTER — Other Ambulatory Visit: Payer: Self-pay | Admitting: General Surgery

## 2020-10-11 DIAGNOSIS — Z170 Estrogen receptor positive status [ER+]: Secondary | ICD-10-CM

## 2020-10-11 DIAGNOSIS — C50411 Malignant neoplasm of upper-outer quadrant of right female breast: Secondary | ICD-10-CM

## 2020-10-11 NOTE — Progress Notes (Signed)
Subjective:     Patient ID: Laura Johns is a 69 y.o. female.  HPI  The following portions of the patient's history were reviewed and updated as appropriate.  This a new patient is here today for: office visit. Patient is here today to follow up from a recent right breast mammogram which showed breast cancer at the 9 o'clock position.  The patient was not aware of any problems on her breast.  She does get regular mammograms.  No prior biopsies.  Status post laparoscopic supracervical hysterectomy without ovary removal in the past.  The patient continues to work full-time at [employer].  She has 1 daughter, 3 granddaughters and 3 great grandsons.  She is accompanied today by her spouse of 45 years, Marcello Moores.   The patient did have an episode of C. difficile colitis in 2018.  Colonoscopy at that time showed no other pathology.  The prior exams in 1997 and 2008 had been negative.  Due to the distortion of the mucosa during the episode of C. difficile colitis, I think she would be a candidate for repeat colonoscopy in the future, 2023.       Chief Complaint  Patient presents with  . Breast Cancer    discussion     BP 148/85   Pulse 67   Temp 36.2 C (97.1 F)   Ht 152.4 cm (5')   Wt 58.1 kg (128 lb)   LMP 12/30/1996 (Exact Date)   BMI 25.00 kg/m       Past Medical History:  Diagnosis Date  . Clostridium difficile infection 08/01/2017   Treated with Flagyl  . Diverticulitis   . Essential hypertension, benign 06/30/2014  . Other and unspecified angina pectoris (CMS-HCC) 06/30/2014  . Other and unspecified hyperlipidemia 06/30/2014          Past Surgical History:  Procedure Laterality Date  . CATARACT EXTRACTION Right 08/25/2014  . cataract surgery Left 04/14/2020  . COLONOSCOPY  06/19/2007, 07/14/1996   Normal Colon: CBF 05/2017  . COLONOSCOPY  09/05/2017   Int Hemorrhoids, Diveticulosis: No repeat per RTE  . EGD  09/05/2017  .  HYSTERECTOMY  1998   cervix still present  . INCISIONAL BIOPSY BREAST Right 09/27/2020              OB History    Gravida  2   Para  1   Term  1   Preterm      AB  1   Living  1     SAB  1   TAB      Ectopic      Molar      Multiple      Live Births          Obstetric Comments  Age at first period 49 Age of first pregnancy 22         Social History          Socioeconomic History  . Marital status: Married    Spouse name: Not on file  . Number of children: Not on file  . Years of education: Not on file  . Highest education level: Not on file  Occupational History  . Occupation: Industrial/product designer    Comment: Shipping Department  Tobacco Use  . Smoking status: Never Smoker  . Smokeless tobacco: Never Used  Vaping Use  . Vaping Use: Never used  Substance and Sexual Activity  . Alcohol use: No  . Drug use: No  . Sexual  activity: Yes    Partners: Male    Birth control/protection: Surgical    Comment: Hysterectomy  Other Topics Concern  . Would you please tell us about the people who live in your home, your pets, or anything else important to your social life? Not Asked  Social History Narrative   Marital status- Married   Lives with husband   Employment- Herbalist (shipping)   Exercise hx- Walks 30 minutes daily   Religious affliation- [employer]   Social Determinants of Health      Financial Resource Strain:   . Difficulty of Paying Living Expenses:   Food Insecurity:   . Worried About Charity fundraiser in the Last Year:   . Arboriculturist in the Last Year:   Transportation Needs:   . Film/video editor (Medical):   Marland Kitchen Lack of Transportation (Non-Medical):             Allergies  Allergen Reactions  . Iodinated Contrast Media Rash  . Lipitor [Atorvastatin] Muscle Pain  . Lovastatin Muscle Pain    Myalgias and muscle cramps in legs  . Morphine Anxiety  . Nexium  [Esomeprazole Magnesium] Rash  . Pravastatin Muscle Pain    Current Medications        Current Outpatient Medications  Medication Sig Dispense Refill  . aspirin 81 MG EC tablet Take 81 mg by mouth once daily.    . cholecalciferol (VITAMIN D3) 1,000 unit tablet Take 1,000 Units by mouth once daily     . ezetimibe (ZETIA) 10 mg tablet TAKE 1 TABLET BY MOUTH ONCE DAILY 90 tablet 3  . hydroCHLOROthiazide (HYDRODIURIL) 25 MG tablet TAKE 1 TABLET BY MOUTH ONCE DAILY 90 tablet 3  . lisinopriL (ZESTRIL) 10 MG tablet TAKE 2 TABLETS BY MOUTH  ONCE DAILY 180 tablet 3  . miconazole/cleanser 17 on wipe (MONISTAT 7 CREAM, APPL, WIPES VAGL) Place vaginally Prn    . triamcinolone 0.1 % cream Apply topically once daily 45 g 1   No current facility-administered medications for this visit.           Family History  Problem Relation Age of Onset  . Stroke Mother   . Stroke Father   . High blood pressure (Hypertension) Father   . High blood pressure (Hypertension) Sister   . High blood pressure (Hypertension) Brother   . High blood pressure (Hypertension) Brother   . Colon cancer Maternal Grandmother   . Breast cancer Neg Hx       Review of Systems  Constitutional: Negative for chills and fever.  Respiratory: Negative for cough.        Objective:   Physical Exam Exam conducted with a chaperone present.  Constitutional:      Appearance: Normal appearance.  HENT:     Head: Normocephalic.  Eyes:     General: No scleral icterus.    Pupils: Pupils are equal, round, and reactive to light.  Cardiovascular:     Rate and Rhythm: Normal rate and regular rhythm.  Pulmonary:     Effort: Pulmonary effort is normal.     Breath sounds: Normal breath sounds.  Chest:     Breasts:        Right: Normal.        Left: Normal.  Musculoskeletal:     Cervical back: Normal range of motion and neck supple.  Lymphadenopathy:     Upper Body:     Right upper body: No  supraclavicular or axillary  adenopathy.     Left upper body: No supraclavicular or axillary adenopathy.  Neurological:     Mental Status: She is alert.      Labs and Radiology:  September 27, 2020 biopsy:  BREAST, RIGHT AT 9:00, 2 CM FROM THE NIPPLE; ULTRASOUND-GUIDED CORE  NEEDLE BIOPSY:  - INVASIVE MAMMARY CARCINOMA, NO SPECIAL TYPE.   Size of invasive carcinoma: 5 mm in this sample  Histologic grade of invasive carcinoma: Grade 1            Glandular/tubular differentiation score: 2            Nuclear pleomorphism score: 1            Mitotic rate score: 1            Total score: 4  Ductal carcinoma in situ: Present, low-grade  Lymphovascular invasion: Not identified    Mammograms from July 08, 2018 through September 27, 2020 were independently reviewed, as well as associated ultrasound examinations.  Developing density between 2019 and 2021 in the lateral aspect of the right breast.  Laboratory studies dated April 12, 2020:  WBC 4.0 - 10.5 K/uL 3.9Low   RBC 3.87 - 5.11 MIL/uL 5.31High   Hemoglobin 12.0 - 15.0 g/dL 13.4   HCT 36 - 46 % 43.2   MCV 80.0 - 100.0 fL 81.4   MCH 26.0 - 34.0 pg 25.2Low   MCHC 30.0 - 36.0 g/dL 31.0   RDW 11.5 - 15.5 % 14.8   Platelets 150 - 400 K/uL 318   nRBC 0.0 - 0.2 % 0.0   Neutrophils Relative % % 36   Neutro Abs 1.7 - 7.7 K/uL 1.4Low   Lymphocytes Relative % 48   Lymphs Abs 0.7 - 4.0 K/uL 1.9   Monocytes Relative % 10   Monocytes Absolute 0 - 1 K/uL 0.4   Eosinophils Relative % 4   Eosinophils Absolute 0 - 0 K/uL 0.2   Basophils Relative % 2   Basophils Absolute 0 - 0 K/uL 0.1    Sodium 135 - 145 mmol/L 139   Potassium 3.5 - 5.1 mmol/L 4.3   Chloride 98 - 111 mmol/L 102   CO2 22 - 32 mmol/L 27   Glucose, Bld 70 - 99 mg/dL 86   Comment: Glucose reference range applies only to samples taken after fasting for at least 8 hours.  BUN 8 - 23 mg/dL 21   Creatinine, Ser  0.44 - 1.00 mg/dL 0.96   Calcium 8.9 - 10.3 mg/dL 10.2   GFR calc non Af Amer >60 mL/min >60   GFR calc Af Amer >60 mL/min >60   Anion gap 5 - 15 10    Ultrasound examination of the right breast was undertaken to determine if preoperative wire localization would be required.  In spite of the large clip utilized and modest breast volume, the biopsy site could not be clearly seen.  Wire localization will be required.    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    Plan:     Over 50% of the 1 hour visit was spent reviewing options for management.  Breast conservation and mastectomy were presented as equivalent.  Pros and cons of each were reviewed.  Role for postoperative radiation therapy if breast conservation is chosen was reviewed.  An informational brochure as well as appropriate websites were provided.   The patient is scheduled to meet with medical oncology later this week.  The patient was encouraged to call if she has any questions or desires to schedule surgery, follow-up otherwise will be based on her needs.  Based on present review of information available I do not see that she is going to be a candidate for adjuvant or neoadjuvant chemotherapy.  Hormone receptor status is pending at this time.    Patient to call the office back after she sees Dr. Rogue Bussing.   Entered by Ledell Noss, CMA, acting as a scribe for Dr. Hervey Ard, MD.   The documentation recorded by the scribe accurately reflects the service I personally performed and the decisions made by me.   Robert Bellow, MD FACS       Electronically signed by Mayer Masker, MD on 10/02/2020 3:34 PM

## 2020-10-17 ENCOUNTER — Encounter
Admission: RE | Admit: 2020-10-17 | Discharge: 2020-10-17 | Disposition: A | Discharge status: Home / Self Care | Payer: Medicare Other | Source: Ambulatory Visit | Attending: General Surgery | Admitting: General Surgery

## 2020-10-17 ENCOUNTER — Other Ambulatory Visit: Payer: Self-pay

## 2020-10-17 DIAGNOSIS — Z01818 Encounter for other preprocedural examination: Secondary | ICD-10-CM | POA: Insufficient documentation

## 2020-10-17 HISTORY — DX: Malignant (primary) neoplasm, unspecified: C80.1

## 2020-10-17 NOTE — Patient Instructions (Addendum)
Your procedure is scheduled on: 10-20-20 FRIDAY Report to [place] [facility] @ 7:45 AM  REMEMBER: Instructions that are not followed completely may result in serious medical risk, up to and including death; or upon the discretion of your surgeon and anesthesiologist your surgery may need to be rescheduled.  Do not eat food after midnight the night before surgery.  No gum chewing, lozengers or hard candies.  You may however, drink CLEAR liquids up to 2 hours before you are scheduled to arrive for your surgery. Do not drink anything within 2 hours of your scheduled arrival time.  Clear liquids include: - water  - apple juice without pulp - gatorade (not RED, PURPLE, OR BLUE) - black coffee or tea (Do NOT add milk or creamers to the coffee or tea) Do NOT drink anything that is not on this list.  TAKE THESE MEDICATIONS THE MORNING OF SURGERY WITH A SIP OF WATER: -ZETIA (EZETIMIBE)  Follow recommendations from Cardiologist, Pulmonologist or PCP regarding stopping Aspirin, Coumadin, Plavix, Eliquis, Pradaxa, or Pletal-INSTRUCTED BY DR BYRNETT'S OFFICE TO CONTINUE ASPIRIN. DO NOT TAKE THE MORNING OF SURGERY  One week prior to surgery: Stop Anti-inflammatories (NSAIDS) such as Advil, Aleve, Ibuprofen, Motrin, Naproxen, Naprosyn and Aspirin based products such as Excedrin, Goodys Powder, BC Powder-OK TO TAKE TYLENOL IF NEEDED  Stop ANY OVER THE COUNTER supplements until after surgery. (You may continue your Vitamin D)  No Alcohol for 24 hours before or after surgery.  No Smoking including e-cigarettes for 24 hours prior to surgery.  No chewable tobacco products for at least 6 hours prior to surgery.  No nicotine patches on the day of surgery.  Do not use any "recreational" drugs for at least a week prior to your surgery.  Please be advised that the combination of cocaine and anesthesia may have negative outcomes, up to and including death. If you test positive for cocaine, your  surgery will be cancelled.  On the morning of surgery brush your teeth with toothpaste and water, you may rinse your mouth with mouthwash if you wish. Do not swallow any toothpaste or mouthwash.  Do not wear jewelry, make-up, hairpins, clips or nail polish.  Do not wear lotions, powders, or perfumes.   Do not shave 48 hours prior to surgery.   Contact lenses, hearing aids and dentures may not be worn into surgery.  Do not bring valuables to the hospital. [facility] is not responsible for any missing/lost belongings or valuables.   Use CHG Soap as directed on instruction sheet..   Notify your doctor if there is any change in your medical condition (cold, fever, infection).  Wear comfortable clothing (specific to your surgery type) to the hospital.  Plan for stool softeners for home use; pain medications have a tendency to cause constipation. You can also help prevent constipation by eating foods high in fiber such as fruits and vegetables and drinking plenty of fluids as your diet allows.  After surgery, you can help prevent lung complications by doing breathing exercises.  Take deep breaths and cough every 1-2 hours. Your doctor may order a device called an Incentive Spirometer to help you take deep breaths. When coughing or sneezing, hold a pillow firmly against your incision with both hands. This is called "splinting." Doing this helps protect your incision. It also decreases belly discomfort.  If you are being admitted to the hospital overnight, leave your suitcase in the car. After surgery it may be brought to your room.  If you  are being discharged the day of surgery, you will not be allowed to drive home. You will need a responsible adult (18 years or older) to drive you home and stay with you that night.   If you are taking public transportation, you will need to have a responsible adult (18 years or older) with you. Please confirm with your physician that it is acceptable  to use public transportation.   Please call the [facility] at [phone] if you have any questions about these instructions.  Visitation Policy:  Patients undergoing a surgery or procedure may have one family member or support person with them as long as that person is not COVID-19 positive or experiencing its symptoms.  That person may remain in the waiting area during the procedure.  Inpatient Visitation Update:   In an effort to ensure the safety of our team members and our patients, we are implementing a change to our visitation policy:  Effective Monday, Aug. 9, at 7 a.m., inpatients will be allowed one support person.  o The support person may change daily.  o The support person must pass our screening, gel in and out, and wear a mask at all times, including in the patient's room.  o Patients must also wear a mask when staff or their support person are in the room.  o Masking is required regardless of vaccination status.  Systemwide, no visitors 17 or younger.

## 2020-10-18 ENCOUNTER — Other Ambulatory Visit
Admission: RE | Admit: 2020-10-18 | Discharge: 2020-10-18 | Disposition: A | Discharge status: Home / Self Care | Payer: Medicare Other | Source: Ambulatory Visit | Attending: General Surgery | Admitting: General Surgery

## 2020-10-18 DIAGNOSIS — Z20822 Contact with and (suspected) exposure to covid-19: Secondary | ICD-10-CM | POA: Insufficient documentation

## 2020-10-18 DIAGNOSIS — Z01812 Encounter for preprocedural laboratory examination: Secondary | ICD-10-CM | POA: Diagnosis present

## 2020-10-18 LAB — SARS CORONAVIRUS 2 (TAT 6-24 HRS): SARS Coronavirus 2: NEGATIVE

## 2020-10-18 LAB — POTASSIUM: Potassium: 3.7 mmol/L (ref 3.5–5.1)

## 2020-10-19 MED ORDER — LACTATED RINGERS IV SOLN: INTRAVENOUS | Status: DC

## 2020-10-19 MED ORDER — FAMOTIDINE 20 MG PO TABS: 20.0000 mg | ORAL_TABLET | Freq: Once | ORAL | Status: AC

## 2020-10-19 MED ORDER — CEFAZOLIN SODIUM-DEXTROSE 2-4 GM/100ML-% IV SOLN
2.0000 g | INTRAVENOUS | Status: AC
  Administered 2020-10-20: 2 g via INTRAVENOUS

## 2020-10-19 MED ORDER — ORAL CARE MOUTH RINSE: 15.0000 mL | Freq: Once | OROMUCOSAL | Status: AC

## 2020-10-19 MED ORDER — CHLORHEXIDINE GLUCONATE 0.12 % MT SOLN: 15.0000 mL | Freq: Once | OROMUCOSAL | Status: AC

## 2020-10-20 ENCOUNTER — Ambulatory Visit
Admission: RE | Admit: 2020-10-20 | Discharge: 2020-10-20 | Disposition: A | Discharge status: Home / Self Care | Payer: Medicare Other | Source: Ambulatory Visit | Attending: General Surgery | Admitting: General Surgery

## 2020-10-20 ENCOUNTER — Encounter
Admission: RE | Disposition: A | Discharge status: Home / Self Care | Payer: Self-pay | Source: Home / Self Care | Attending: General Surgery

## 2020-10-20 ENCOUNTER — Ambulatory Visit: Payer: Medicare Other | Admitting: Anesthesiology

## 2020-10-20 ENCOUNTER — Other Ambulatory Visit: Payer: Self-pay

## 2020-10-20 ENCOUNTER — Encounter: Payer: Self-pay | Admitting: General Surgery

## 2020-10-20 ENCOUNTER — Ambulatory Visit
Admission: RE | Admit: 2020-10-20 | Discharge: 2020-10-20 | Disposition: A | Discharge status: Home / Self Care | Payer: Medicare Other | Attending: General Surgery | Admitting: General Surgery

## 2020-10-20 DIAGNOSIS — C50511 Malignant neoplasm of lower-outer quadrant of right female breast: Secondary | ICD-10-CM | POA: Diagnosis not present

## 2020-10-20 DIAGNOSIS — Z79899 Other long term (current) drug therapy: Secondary | ICD-10-CM | POA: Diagnosis not present

## 2020-10-20 DIAGNOSIS — Z170 Estrogen receptor positive status [ER+]: Secondary | ICD-10-CM

## 2020-10-20 DIAGNOSIS — C50411 Malignant neoplasm of upper-outer quadrant of right female breast: Secondary | ICD-10-CM

## 2020-10-20 DIAGNOSIS — I10 Essential (primary) hypertension: Secondary | ICD-10-CM | POA: Diagnosis not present

## 2020-10-20 HISTORY — PX: AXILLARY SENTINEL NODE BIOPSY: SHX5738

## 2020-10-20 HISTORY — PX: BREAST LUMPECTOMY WITH NEEDLE LOCALIZATION: SHX5759

## 2020-10-20 SURGERY — BREAST LUMPECTOMY WITH NEEDLE LOCALIZATION
Anesthesia: General | Laterality: Right

## 2020-10-20 MED ORDER — FENTANYL CITRATE (PF) 100 MCG/2ML IJ SOLN
INTRAMUSCULAR | Status: DC | PRN
  Administered 2020-10-20 (×5): 25 ug via INTRAVENOUS

## 2020-10-20 MED ORDER — CHLORHEXIDINE GLUCONATE 0.12 % MT SOLN
OROMUCOSAL | Status: AC
  Administered 2020-10-20: 15 mL via OROMUCOSAL
  Filled 2020-10-20: qty 15

## 2020-10-20 MED ORDER — METHYLENE BLUE 0.5 % INJ SOLN
INTRAVENOUS | Status: DC | PRN
  Administered 2020-10-20: 5 mL

## 2020-10-20 MED ORDER — TECHNETIUM TC 99M SULFUR COLLOID FILTERED
0.5450 | Freq: Once | INTRAVENOUS | Status: AC | PRN
  Administered 2020-10-20: 0.545 via INTRADERMAL

## 2020-10-20 MED ORDER — ONDANSETRON HCL 4 MG/2ML IJ SOLN: 4.0000 mg | Freq: Once | INTRAMUSCULAR | Status: DC | PRN

## 2020-10-20 MED ORDER — PROPOFOL 10 MG/ML IV BOLUS
INTRAVENOUS | Status: DC | PRN
  Administered 2020-10-20: 150 mg via INTRAVENOUS
  Administered 2020-10-20: 30 mg via INTRAVENOUS
  Administered 2020-10-20: 20 mg via INTRAVENOUS

## 2020-10-20 MED ORDER — METHYLENE BLUE 0.5 % INJ SOLN
INTRAVENOUS | Status: AC
  Filled 2020-10-20: qty 10

## 2020-10-20 MED ORDER — BUPIVACAINE-EPINEPHRINE (PF) 0.5% -1:200000 IJ SOLN
INTRAMUSCULAR | Status: DC | PRN
  Administered 2020-10-20: 20 mL
  Administered 2020-10-20: 10 mL

## 2020-10-20 MED ORDER — ONDANSETRON HCL 4 MG/2ML IJ SOLN
INTRAMUSCULAR | Status: DC | PRN
  Administered 2020-10-20: 4 mg via INTRAVENOUS

## 2020-10-20 MED ORDER — FAMOTIDINE 20 MG PO TABS
ORAL_TABLET | ORAL | Status: AC
  Filled 2020-10-20: qty 1

## 2020-10-20 MED ORDER — BUPIVACAINE-EPINEPHRINE (PF) 0.5% -1:200000 IJ SOLN
INTRAMUSCULAR | Status: AC
  Filled 2020-10-20: qty 90

## 2020-10-20 MED ORDER — LIDOCAINE HCL (CARDIAC) PF 100 MG/5ML IV SOSY
PREFILLED_SYRINGE | INTRAVENOUS | Status: DC | PRN
  Administered 2020-10-20: 100 mg via INTRAVENOUS

## 2020-10-20 MED ORDER — FENTANYL CITRATE (PF) 100 MCG/2ML IJ SOLN: 25.0000 ug | INTRAMUSCULAR | Status: DC | PRN

## 2020-10-20 MED ORDER — CEFAZOLIN SODIUM-DEXTROSE 2-4 GM/100ML-% IV SOLN
INTRAVENOUS | Status: AC
  Filled 2020-10-20: qty 100

## 2020-10-20 MED ORDER — HYDROCODONE-ACETAMINOPHEN 5-325 MG PO TABS: 1.0000 | ORAL_TABLET | ORAL | 0 refills | Status: DC | PRN

## 2020-10-20 MED ORDER — ACETAMINOPHEN 10 MG/ML IV SOLN
INTRAVENOUS | Status: AC
  Filled 2020-10-20: qty 100

## 2020-10-20 MED ORDER — ACETAMINOPHEN 10 MG/ML IV SOLN
INTRAVENOUS | Status: DC | PRN
  Administered 2020-10-20: 1000 mg via INTRAVENOUS

## 2020-10-20 MED ORDER — EPHEDRINE SULFATE 50 MG/ML IJ SOLN
INTRAMUSCULAR | Status: DC | PRN
  Administered 2020-10-20: 10 mg via INTRAVENOUS

## 2020-10-20 MED ORDER — FENTANYL CITRATE (PF) 100 MCG/2ML IJ SOLN
INTRAMUSCULAR | Status: AC
  Filled 2020-10-20: qty 2

## 2020-10-20 MED ORDER — FAMOTIDINE 20 MG PO TABS
ORAL_TABLET | ORAL | Status: AC
  Administered 2020-10-20: 20 mg via ORAL
  Filled 2020-10-20: qty 1

## 2020-10-20 MED ORDER — PROPOFOL 10 MG/ML IV BOLUS
INTRAVENOUS | Status: AC
  Filled 2020-10-20: qty 20

## 2020-10-20 SURGICAL SUPPLY — 57 items
APL PRP STRL LF DISP 70% ISPRP (MISCELLANEOUS) ×1
BINDER BREAST LRG (GAUZE/BANDAGES/DRESSINGS) IMPLANT
BINDER BREAST MEDIUM (GAUZE/BANDAGES/DRESSINGS) ×3 IMPLANT
BINDER BREAST XLRG (GAUZE/BANDAGES/DRESSINGS) IMPLANT
BINDER BREAST XXLRG (GAUZE/BANDAGES/DRESSINGS) IMPLANT
BLADE BOVIE TIP EXT 4 (BLADE) IMPLANT
BLADE SURG 15 STRL SS SAFETY (BLADE) ×6 IMPLANT
BULB RESERV EVAC DRAIN JP 100C (MISCELLANEOUS) IMPLANT
CANISTER SUCT 1200ML W/VALVE (MISCELLANEOUS) ×3 IMPLANT
CHLORAPREP W/TINT 26 (MISCELLANEOUS) ×3 IMPLANT
CLOSURE WOUND 1/2 X4 (GAUZE/BANDAGES/DRESSINGS) ×1
CNTNR SPEC 2.5X3XGRAD LEK (MISCELLANEOUS)
CONT SPEC 4OZ STER OR WHT (MISCELLANEOUS)
CONT SPEC 4OZ STRL OR WHT (MISCELLANEOUS)
CONTAINER SPEC 2.5X3XGRAD LEK (MISCELLANEOUS) IMPLANT
COVER PROBE FLX POLY STRL (MISCELLANEOUS) ×3 IMPLANT
COVER WAND RF STERILE (DRAPES) ×3 IMPLANT
DEVICE DUBIN SPECIMEN MAMMOGRA (MISCELLANEOUS) ×3 IMPLANT
DRAIN CHANNEL JP 15F RND 16 (MISCELLANEOUS) IMPLANT
DRAPE LAPAROTOMY TRNSV 106X77 (MISCELLANEOUS) ×3 IMPLANT
DRSG GAUZE FLUFF 36X18 (GAUZE/BANDAGES/DRESSINGS) ×6 IMPLANT
DRSG TELFA 3X8 NADH (GAUZE/BANDAGES/DRESSINGS) ×6 IMPLANT
ELECT CAUTERY BLADE TIP 2.5 (TIP) ×3
ELECT REM PT RETURN 9FT ADLT (ELECTROSURGICAL) ×3
ELECTRODE CAUTERY BLDE TIP 2.5 (TIP) ×1 IMPLANT
ELECTRODE REM PT RTRN 9FT ADLT (ELECTROSURGICAL) ×1 IMPLANT
GLOVE BIO SURGEON STRL SZ7.5 (GLOVE) ×3 IMPLANT
GLOVE INDICATOR 8.0 STRL GRN (GLOVE) ×3 IMPLANT
GOWN STRL REUS W/ TWL LRG LVL3 (GOWN DISPOSABLE) ×2 IMPLANT
GOWN STRL REUS W/TWL LRG LVL3 (GOWN DISPOSABLE) ×6
KIT TURNOVER KIT A (KITS) ×3 IMPLANT
LABEL OR SOLS (LABEL) ×3 IMPLANT
MARGIN MAP 10MM (MISCELLANEOUS) ×3 IMPLANT
NEEDLE HYPO 22GX1.5 SAFETY (NEEDLE) ×3 IMPLANT
NEEDLE HYPO 25X1 1.5 SAFETY (NEEDLE) ×6 IMPLANT
PACK BASIN MINOR (MISCELLANEOUS) ×3 IMPLANT
RETRACTOR RING XSMALL (MISCELLANEOUS) IMPLANT
RTRCTR WOUND ALEXIS 13CM XS SH (MISCELLANEOUS)
SHEARS FOC LG CVD HARMONIC 17C (MISCELLANEOUS) IMPLANT
SHEARS HARMONIC 9CM CVD (BLADE) IMPLANT
SLEVE PROBE SENORX GAMMA FIND (MISCELLANEOUS) IMPLANT
STRIP CLOSURE SKIN 1/2X4 (GAUZE/BANDAGES/DRESSINGS) ×2 IMPLANT
SUT ETHILON 3-0 FS-10 30 BLK (SUTURE) ×3
SUT SILK 2 0 (SUTURE) ×3
SUT SILK 2-0 18XBRD TIE 12 (SUTURE) ×1 IMPLANT
SUT VIC AB 2-0 CT1 27 (SUTURE) ×6
SUT VIC AB 2-0 CT1 TAPERPNT 27 (SUTURE) ×2 IMPLANT
SUT VIC AB 3-0 SH 27 (SUTURE) ×6
SUT VIC AB 3-0 SH 27X BRD (SUTURE) ×2 IMPLANT
SUT VIC AB 4-0 FS2 27 (SUTURE) ×6 IMPLANT
SUT VICRYL+ 3-0 144IN (SUTURE) ×3 IMPLANT
SUTURE EHLN 3-0 FS-10 30 BLK (SUTURE) ×1 IMPLANT
SWABSTK COMLB BENZOIN TINCTURE (MISCELLANEOUS) ×3 IMPLANT
SYR 10ML LL (SYRINGE) ×3 IMPLANT
SYR BULB IRRIG 60ML STRL (SYRINGE) ×3 IMPLANT
TAPE TRANSPORE STRL 2 31045 (GAUZE/BANDAGES/DRESSINGS) ×3 IMPLANT
WATER STERILE IRR 1000ML POUR (IV SOLUTION) ×3 IMPLANT

## 2020-10-20 NOTE — Anesthesia Preprocedure Evaluation (Signed)
Anesthesia Evaluation  Patient identified by MRN, date of birth, ID band Patient awake    Reviewed: Allergy & Precautions, NPO status , Patient's Chart, lab work & pertinent test results  History of Anesthesia Complications Negative for: history of anesthetic complications  Airway Mallampati: II       Dental   Pulmonary neg sleep apnea, neg COPD, Not current smoker,           Cardiovascular hypertension, Pt. on medications (-) Past MI and (-) CHF (-) dysrhythmias (-) Valvular Problems/Murmurs     Neuro/Psych neg Seizures    GI/Hepatic Neg liver ROS, neg GERD  ,  Endo/Other  neg diabetes  Renal/GU negative Renal ROS     Musculoskeletal   Abdominal   Peds  Hematology   Anesthesia Other Findings   Reproductive/Obstetrics                             Anesthesia Physical Anesthesia Plan  ASA: II  Anesthesia Plan: General   Post-op Pain Management:    Induction: Intravenous  PONV Risk Score and Plan: 3 and Ondansetron, Dexamethasone and Treatment may vary due to age or medical condition  Airway Management Planned: LMA  Additional Equipment:   Intra-op Plan:   Post-operative Plan:   Informed Consent: I have reviewed the patients History and Physical, chart, labs and discussed the procedure including the risks, benefits and alternatives for the proposed anesthesia with the patient or authorized representative who has indicated his/her understanding and acceptance.       Plan Discussed with:   Anesthesia Plan Comments:         Anesthesia Quick Evaluation

## 2020-10-20 NOTE — H&P (Signed)
Laura Johns [id number] 03-20-1951     HPI: 69 year old woman with recently identified right breast cancer.  Patient has elected breast conservation.  Plans for wide excision and sentinel node biopsy.  Medications Prior to Admission  Medication Sig Dispense Refill Last Dose  . aspirin EC 81 MG tablet Take 81 mg by mouth daily.   10/18/2020  . cholecalciferol (VITAMIN D3) 25 MCG (1000 UNIT) tablet Take 1,000 Units by mouth daily.   10/19/2020 at Unknown time  . ezetimibe (ZETIA) 10 MG tablet Take 10 mg by mouth every morning.    10/20/2020 at Unknown time  . Glycerin-Hypromellose-PEG 400 (DRY EYE RELIEF DROPS) 0.2-0.2-1 % SOLN Place 1 drop into both eyes daily as needed (Dry eye).   Past Month at Unknown time  . hydrochlorothiazide (HYDRODIURIL) 25 MG tablet Take 25 mg by mouth every morning.    10/19/2020 at Unknown time  . lidocaine-prilocaine (EMLA) cream Apply 1 application topically daily.   10/20/2020 at Unknown time  . lisinopril (PRINIVIL,ZESTRIL) 10 MG tablet Take 20 mg by mouth every morning.    10/19/2020 at Unknown time   Allergies  Allergen Reactions  . Atorvastatin Other (See Comments)    Legs hurt  . Lovastatin Other (See Comments)    Myalgias and muscle cramps in legs  . Pravastatin Other (See Comments)    Legs hurt  . Contrast Media [Iodinated Diagnostic Agents] Rash  . Morphine And Related Anxiety  . Nexium [Esomeprazole Magnesium] Rash   Past Medical History:  Diagnosis Date  . Borderline diabetes mellitus   . Cancer ([place])   . Diverticulitis   . Hyperlipemia   . Hypertension    Past Surgical History:  Procedure Laterality Date  . ABDOMINAL HYSTERECTOMY    . BREAST BIOPSY Right 09/27/2020   GRADE I INVASIVE MAMMARY CARCINOMA  . CATARACT EXTRACTION W/PHACO Right 08/25/2014   Procedure: CATARACT EXTRACTION PHACO AND INTRAOCULAR LENS PLACEMENT (IOC);  Surgeon: Tonny Branch, MD;  Location: [facility];  Service: Ophthalmology;  Laterality: Right;  CDE 5.82  .  CATARACT EXTRACTION W/PHACO Left 04/14/2020   Procedure: CATARACT EXTRACTION PHACO AND INTRAOCULAR LENS PLACEMENT LEFT EYE CDE=6.73;  Surgeon: Baruch Goldmann, MD;  Location: [facility];  Service: Ophthalmology;  Laterality: Left;  left  . COLONOSCOPY WITH PROPOFOL N/A 09/05/2017   Procedure: COLONOSCOPY WITH PROPOFOL;  Surgeon: Manya Silvas, MD;  Location: [facility] ENDOSCOPY;  Service: Endoscopy;  Laterality: N/A;  . ESOPHAGOGASTRODUODENOSCOPY (EGD) WITH PROPOFOL N/A 09/05/2017   Procedure: ESOPHAGOGASTRODUODENOSCOPY (EGD) WITH PROPOFOL;  Surgeon: Manya Silvas, MD;  Location: [facility] ENDOSCOPY;  Service: Endoscopy;  Laterality: N/A;   Social History   Socioeconomic History  . Marital status: Married    Spouse name: Not on file  . Number of children: Not on file  . Years of education: Not on file  . Highest education level: Not on file  Occupational History  . Not on file  Tobacco Use  . Smoking status: Never Smoker  . Smokeless tobacco: Never Used  Vaping Use  . Vaping Use: Never used  Substance and Sexual Activity  . Alcohol use: No  . Drug use: No  . Sexual activity: Yes  Other Topics Concern  . Not on file  Social History Narrative   Never smoked; no alcohol; works in Scientist, research (medical). Lives in [place]; [place] address. With husband at home; one daughter- 64 y.    Social Determinants of Health   Financial Resource Strain:   . Difficulty of Paying  Living Expenses: Not on file  Food Insecurity:   . Worried About Charity fundraiser in the Last Year: Not on file  . Ran Out of Food in the Last Year: Not on file  Transportation Needs:   . Lack of Transportation (Medical): Not on file  . Lack of Transportation (Non-Medical): Not on file  Physical Activity:   . Days of Exercise per Week: Not on file  . Minutes of Exercise per Session: Not on file  Stress:   . Feeling of Stress : Not on file  Social Connections:   . Frequency of Communication with Friends and  Family: Not on file  . Frequency of Social Gatherings with Friends and Family: Not on file  . Attends Religious Services: Not on file  . Active Member of Clubs or Organizations: Not on file  . Attends Archivist Meetings: Not on file  . Marital Status: Not on file  Intimate Partner Violence:   . Fear of Current or Ex-Partner: Not on file  . Emotionally Abused: Not on file  . Physically Abused: Not on file  . Sexually Abused: Not on file   Social History   Social History Narrative   Never smoked; no alcohol; works in Scientist, research (medical). Lives in [place]; [place] address. With husband at home; one daughter- 66 y.      ROS: Negative.     PE: HEENT: Negative. Lungs: Clear. Cardio: RR.  Assessment/Plan:  Proceed with planned wide excision of the right breast with sentinel node biopsy. [facility] 10/20/2020

## 2020-10-20 NOTE — Transfer of Care (Signed)
Immediate Anesthesia Transfer of Care Note  Patient: Laura Johns  Procedure(s) Performed: BREAST LUMPECTOMY WITH NEEDLE LOCALIZATION (Right ) AXILLARY SENTINEL NODE BIOPSY (Right )  Patient Location: PACU  Anesthesia Type:General  Level of Consciousness: awake and drowsy  Airway & Oxygen Therapy: Patient Spontanous Breathing and Patient connected to face mask oxygen  Post-op Assessment: Report given to RN and Post -op Vital signs reviewed and stable  Post vital signs: stable  Last Vitals:  Vitals Value Taken Time  BP 133/78 10/20/20 1355  Temp    Pulse 59 10/20/20 1358  Resp 15 10/20/20 1358  SpO2 100 % 10/20/20 1358  Vitals shown include unvalidated device data.  Last Pain:  Vitals:   10/20/20 0939  TempSrc: Oral  PainSc: 0-No pain         Complications: No complications documented.

## 2020-10-20 NOTE — Op Note (Signed)
Preoperative diagnosis: Right breast cancer, desire for breast conservation.  Postoperative diagnosis: Same.  Operative procedure: Right breast wide excision with wire and ultrasound guidance.  Tissue transfer, sentinel node biopsy.  Operating surgeon: Hervey Ard, MD.  Anesthesia: General by LMA, Marcaine 0.5% with 1: 200,000 units of epinephrine, 30 cc.  Estimated blood loss: 5 cc.  Clinical note: This 69 year old woman is was identified with a small, 5 mm cancer of the right breast.  She desired breast conservation.  She was injected with technetium sulfur colloid prior to the procedure.  Wire localization was completed as well.  She tolerated both procedures well.  Operative note: With the patient under adequate general anesthesia a total of 5 cc of 0.5% methylene blue was instilled in the subareolar plexus.  The breast was then cleansed with ChloraPrep and draped.  Ultrasound was used to identify the wire coming in from the 8 o'clock position and heading towards the retroareolar area.  The clip appeared to be just outside the edge of the areola.  It was elected to make use of a circumareolar incision from the 6 to 10 o'clock position.  The skin was incised sharply after injection of local anesthesia.  After dissecting down about 1-1/2 cm a 3 x 4 x 5 cm block of tissue was excised (12 cm defect) down to and including the pectoralis fascia.  This was tagged with margin mark indicators with the wire coming from the lateral edge.  Specimen radiograph confirmed the wire to be intact and the clip in the center of the specimen.  All the breast specimen was being processed by the pathology department attention was turned to the axilla.  Low signals of about 150 were identified in the lower aspect of the axilla.  Local anesthesia was infiltrated.  A small transverse incision was made.  The skin was incised sharply and the remaining dissection completed with electrocautery.  Once the axillary envelope  was opened to small, hot blue lymph nodes with counts just under 200 were identified.  These were 3 and 5 mm in diameter.  Adjacent to this was a larger 5 millimeters nonblue lymph node with counts of about 100.  These were sent altogether as sentinel nodes 1, 2 and 3.  Scanning through the axilla with the node seeker showed no additional areas of increased uptake and no additional blue lymphatics were identified.  The axillary envelope was closed with 2-0 Vicryl figure-of-eight suture.  The adipose layer was approximated in similar fashion.  The skin was closed with a running 4-0 Vicryl subcuticular suture.  Attention was directed return to the graft to close the volume defect.  The breast was elevated off the underlying pectoralis muscle circumferentially and then approximated with interrupted 2-0 Vicryl figure-of-eight sutures.  The thick adipose layer was then approximated in a similar fashion.  The superficial layer was approximated with interrupted 2-0 Vicryl simple sutures.  The skin was then closed with a running 4-Vicryl subcuticular suture.  Benzoin, Steri-Strips, Telfa and followed by fluff gauze and a compressive wrap.  The patient tolerated the procedure well and was taken to the recovery room in stable condition.

## 2020-10-20 NOTE — Anesthesia Postprocedure Evaluation (Signed)
Anesthesia Post Note  Patient: Laura Johns  Procedure(s) Performed: BREAST LUMPECTOMY WITH NEEDLE LOCALIZATION (Right ) AXILLARY SENTINEL NODE BIOPSY (Right )  Patient location during evaluation: PACU Anesthesia Type: General Level of consciousness: awake and alert Pain management: pain level controlled Vital Signs Assessment: post-procedure vital signs reviewed and stable Respiratory status: spontaneous breathing and respiratory function stable Cardiovascular status: stable Anesthetic complications: no   No complications documented.   Last Vitals:  Vitals:   10/20/20 0939 10/20/20 1358  BP: (!) 155/79 133/78  Pulse: 64 (!) 59  Resp: 16 15  Temp: (!) 36.4 C (!) 36.2 C  SpO2: 100% 100%    Last Pain:  Vitals:   10/20/20 1358  TempSrc:   PainSc: Asleep                 Keeshia Sanderlin K

## 2020-10-20 NOTE — Discharge Instructions (Signed)

## 2020-10-21 ENCOUNTER — Encounter: Payer: Self-pay | Admitting: General Surgery

## 2020-10-23 LAB — SURGICAL PATHOLOGY

## 2020-10-23 NOTE — Progress Notes (Signed)
Patient reports she is doing well post-op.  Denies discomfort.  Confirmed post-op visits with surgeon, and Med/Onc.

## 2020-10-24 ENCOUNTER — Other Ambulatory Visit: Payer: Self-pay | Admitting: Anatomic Pathology & Clinical Pathology

## 2020-11-03 ENCOUNTER — Other Ambulatory Visit: Payer: Self-pay

## 2020-11-03 ENCOUNTER — Inpatient Hospital Stay: Payer: Medicare Other | Attending: Internal Medicine | Admitting: Internal Medicine

## 2020-11-03 ENCOUNTER — Encounter: Payer: Self-pay | Admitting: Internal Medicine

## 2020-11-03 DIAGNOSIS — Z79899 Other long term (current) drug therapy: Secondary | ICD-10-CM | POA: Insufficient documentation

## 2020-11-03 DIAGNOSIS — I10 Essential (primary) hypertension: Secondary | ICD-10-CM | POA: Insufficient documentation

## 2020-11-03 DIAGNOSIS — E785 Hyperlipidemia, unspecified: Secondary | ICD-10-CM | POA: Diagnosis not present

## 2020-11-03 DIAGNOSIS — E119 Type 2 diabetes mellitus without complications: Secondary | ICD-10-CM | POA: Diagnosis not present

## 2020-11-03 DIAGNOSIS — C50411 Malignant neoplasm of upper-outer quadrant of right female breast: Secondary | ICD-10-CM | POA: Diagnosis not present

## 2020-11-03 DIAGNOSIS — Z7982 Long term (current) use of aspirin: Secondary | ICD-10-CM | POA: Insufficient documentation

## 2020-11-03 DIAGNOSIS — Z170 Estrogen receptor positive status [ER+]: Secondary | ICD-10-CM | POA: Diagnosis not present

## 2020-11-03 NOTE — Assessment & Plan Note (Signed)
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  #I reviewed the pathology and stage with the patient and husband detail.  Given grade 1; and less than 10 mm size-Oncotype is not recommended.  I do recommend proceeding with adjuvant radiation; considering MammoSite.  Awaiting evaluation with Dr. Donella Stade.  # #Discussed the mechanism of action of aromatase inhibitors-with blocking of estrogen to prevent breast cancer.  Also discussed the potential side effects including but not limited to arthralgias hot flashes and increased risk of osteoporosis.  We will get a bone density test prior to next visit.  #Elevated blood pressure-150s over 90; recommend rechecking at home; keeping a log of blood pressure and reviewing with PCP.  # DISPOSITION: #Radiation oncology referral-diagnosis of breast cancer # follow in 2 months-MD; BMD prior-Dr.B

## 2020-11-03 NOTE — Addendum Note (Signed)
Addended by: Delice Bison E on: 11/03/2020 10:42 AM   Modules accepted: Orders

## 2020-11-03 NOTE — Progress Notes (Signed)
one Laura Johns NOTE  Patient Care Team: [name], MD as PCP - General (Family Medicine) Theodore Demark, RN as Oncology Nurse Navigator (Oncology)  CHIEF COMPLAINTS/PURPOSE OF CONSULTATION: Breast cancer  #  Oncology History Overview Note  # RIGHT BREAST MUCINOUS CARCINOMA: ER/PR-POSITIVE; Her 2 NEG. PT1apN0 [ 45m in Biopsy; 3 mm in final resection]; G-1. Dr.Byrnett   # SURVIVORSHIP:   # GENETICS:   DIAGNOSIS: Right breast cancer  STAGE:   1      ;  GOALS: Cure  CURRENT/MOST RECENT THERAPY : Radiation; AI    Carcinoma of upper-outer quadrant of right breast in female, estrogen receptor positive ([place]  10/06/2020 Initial Diagnosis   Carcin[place] of upper-outer quadrant of right breast in [place], [place]    HISTORY OF PRESENTING ILLNESS:  Laura Sales89y.o.  female patient with right breast cancer ER/PR positive HER-2 negative is here today reviewed the results of the pathology; further treatment plan.  In the interim patient underwent lumpectomy; recuperating fairly well.   She denies any unusual symptoms.  No fever no chills.  Review of Systems  Constitutional: Negative for chills, diaphoresis, fever, malaise/fatigue and weight loss.  HENT: Negative for nosebleeds and sore throat.   Eyes: Negative for double vision.  Respiratory: Negative for cough, hemoptysis, sputum production, shortness of breath and wheezing.   Cardiovascular: Negative for chest pain, palpitations, orthopnea and leg swelling.  Gastrointestinal: Negative for abdominal pain, blood in stool, constipation, diarrhea, heartburn, melena, nausea and vomiting.  Genitourinary: Negative for dysuria, frequency and urgency.  Musculoskeletal: Positive for back pain and joint pain.  Skin: Negative.  Negative for itching and rash.  Neurological: Negative for dizziness, tingling, focal weakness, weakness and headaches.  Endo/Heme/Allergies: Does not bruise/bleed  easily.  Psychiatric/Behavioral: Negative for depression. The patient is not nervous/anxious and does not have insomnia.      MEDICAL HISTORY:  Past Medical History:  Diagnosis Date  . Borderline diabetes mellitus   . [place]   . Diverticulitis   . Hyperlipemia   . Hypertension     SURGICAL HISTORY: Past Surgical History:  Procedure Laterality Date  . ABDOMINAL HYSTERECTOMY    . AXILLARY SENTINEL NODE BIOPSY Right 10/20/2020   Procedure: AXILLARY SENTINEL NODE BIOPSY;  Surgeon: BRobert Bellow MD;  Location: [facility] ORS;  Service: General;  Laterality: Right;  . BREAST BIOPSY Right 09/27/2020   GRADE I INVASIVE MAMMARY CARCINOMA  . BREAST LUMPECTOMY WITH NEEDLE LOCALIZATION Right 10/20/2020   Procedure: BREAST LUMPECTOMY WITH NEEDLE LOCALIZATION;  Surgeon: BRobert Bellow MD;  Location: [facility] ORS;  Service: General;  Laterality: Right;  . CATARACT EXTRACTION W/PHACO Right 08/25/2014   Procedure: CATARACT EXTRACTION PHACO AND INTRAOCULAR LENS PLACEMENT (IOC);  Surgeon: KTonny Branch MD;  Location: [facility];  Service: Ophthalmology;  Laterality: Right;  CDE 5.82  . CATARACT EXTRACTION W/PHACO Left 04/14/2020   Procedure: CATARACT EXTRACTION PHACO AND INTRAOCULAR LENS PLACEMENT LEFT EYE CDE=6.73;  Surgeon: WBaruch Goldmann MD;  Location: [facility];  Service: Ophthalmology;  Laterality: Left;  left  . COLONOSCOPY WITH PROPOFOL N/A 09/05/2017   Procedure: COLONOSCOPY WITH PROPOFOL;  Surgeon: EManya Silvas MD;  [facility];  Service: Endoscopy;  Laterality: N/A;  . ESOPHAGOGASTRODUODENOSCOPY (EGD) WITH PROPOFOL N/A 09/05/2017   Procedure: ESOPHAGOGASTRODUODENOSCOPY (EGD) WITH PROPOFOL;  Surgeon: EManya Silvas MD;  [facility];  Service: Endoscopy;  Laterality: N/A;    SOCIAL HISTORY: Social History   Socioeconomic History  .  Marital status: Married    Spouse name: Not on file  . Number of children: Not on file  . Years of education: Not on file  .  Highest education level: Not on file  Occupational History  . Not on file  Tobacco Use  . Smoking status: Never Smoker  . Smokeless tobacco: Never Used  Vaping Use  . Vaping Use: Never used  Substance and Sexual Activity  . Alcohol use: No  . Drug use: No  . Sexual activity: Yes  Other Topics Concern  . Not on file  Social History Narrative   Never smoked; no alcohol; works in Scientist, research (medical). Lives in [place]; [address] address. With husband at home; one daughter- 48 y.    Social Determinants of Health   Financial Resource Strain:   . Difficulty of Paying Living Expenses: Not on file  Food Insecurity:   . Worried About Charity fundraiser in the Last Year: Not on file  . Ran Out of Food in the Last Year: Not on file  Transportation Needs:   . Lack of Transportation (Medical): Not on file  . Lack of Transportation (Non-Medical): Not on file  Physical Activity:   . Days of Exercise per Week: Not on file  . Minutes of Exercise per Session: Not on file  Stress:   . Feeling of Stress : Not on file  Social Connections:   . Frequency of Communication with Friends and Family: Not on file  . Frequency of Social Gatherings with Friends and Family: Not on file  . Attends Religious Services: Not on file  . Active Member of Clubs or Organizations: Not on file  . Attends Archivist Meetings: Not on file  . Marital Status: Not on file  Intimate Partner Violence:   . Fear of Current or Ex-Partner: Not on file  . Emotionally Abused: Not on file  . Physically Abused: Not on file  . Sexually Abused: Not on file    FAMILY HISTORY: Family History  Problem Relation Age of Onset  . Stroke Mother   . Heart attack Father   . Stroke Father   . Breast cancer Neg Hx     ALLERGIES:  is allergic to atorvastatin, lovastatin, pravastatin, contrast media [iodinated diagnostic agents], morphine and related, and nexium [esomeprazole magnesium].  MEDICATIONS:   Current Outpatient Medications  Medication Sig Dispense Refill  . aspirin EC 81 MG tablet Take 81 mg by mouth daily.    . cholecalciferol (VITAMIN D3) 25 MCG (1000 UNIT) tablet Take 1,000 Units by mouth daily.    Marland Kitchen ezetimibe (ZETIA) 10 MG tablet Take 10 mg by mouth every morning.     . hydrochlorothiazide (HYDRODIURIL) 25 MG tablet Take 25 mg by mouth every morning.     Marland Kitchen lisinopril (PRINIVIL,ZESTRIL) 10 MG tablet Take 20 mg by mouth every morning.     . Glycerin-Hypromellose-PEG 400 (DRY EYE RELIEF DROPS) 0.2-0.2-1 % SOLN Place 1 drop into both eyes daily as needed (Dry eye). (Patient not taking: Reported on 11/03/2020)    . HYDROcodone-acetaminophen (NORCO/VICODIN) 5-325 MG tablet Take 1 tablet by mouth every 4 (four) hours as needed for moderate pain. (Patient not taking: Reported on 11/03/2020) 12 tablet 0   No current facility-administered medications for this visit.      Marland Kitchen  PHYSICAL EXAMINATION: ECOG PERFORMANCE STATUS: 0 - Asymptomatic  Vitals:   11/03/20 0940  BP: (!) 156/96  Pulse: 73  Temp: (!) 97.1 F (36.2 C)  SpO2: 100%   Filed Weights   11/03/20 0940  Weight: 123 lb 9.6 oz (56.1 kg)    Physical Exam HENT:     Head: Normocephalic and atraumatic.     Mouth/Throat:     Pharynx: No oropharyngeal exudate.  Eyes:     Pupils: Pupils are equal, round, and reactive to light.  Cardiovascular:     Rate and Rhythm: Normal rate and regular rhythm.  Pulmonary:     Effort: No respiratory distress.     Breath sounds: No wheezing.  Abdominal:     General: Bowel sounds are normal. There is no distension.     Palpations: Abdomen is soft. There is no mass.     Tenderness: There is no abdominal tenderness. There is no guarding or rebound.  Musculoskeletal:        General: No tenderness. Normal range of motion.     Cervical back: Normal range of motion and neck supple.  Skin:    General: Skin is warm.  Neurological:     Mental Status: She is alert and oriented to person,  place, and time.  Psychiatric:        Mood and Affect: Affect normal.      LABORATORY DATA:  I have reviewed the data as listed Lab Results  Component Value Date   WBC 3.9 (L) 04/12/2020   HGB 13.4 04/12/2020   HCT 43.2 04/12/2020   MCV 81.4 04/12/2020   PLT 318 04/12/2020   Recent Labs    04/12/20 1002 10/18/20 0908  NA 139  --   K 4.3 3.7  CL 102  --   CO2 27  --   GLUCOSE 86  --   BUN 21  --   CREATININE 0.96  --   CALCIUM 10.2  --   GFRNONAA >60  --   GFRAA >60  --     RADIOGRAPHIC STUDIES: I have personally reviewed the radiological images as listed and agreed with the findings in the report. NM SENTINEL NODE INJECTION  Result Date: 10/20/2020 CLINICAL DATA:  Right breast cancer. EXAM: NUCLEAR MEDICINE BREAST LYMPHOSCINTIGRAPHY TECHNIQUE: Intradermal injection of radiopharmaceutical was performed at the 12 o'clock, 3 o'clock, 6 o'clock, and 9 o'clock positions around the right nipple. The patient was then sent to the operating room where the sentinel node(s) were identified and removed by the surgeon. RADIOPHARMACEUTICALS:  Total of 1 mCi Millipore-filtered Technetium-61msulfur colloid, injected in four aliquots of 0.25 mCi each. IMPRESSION: Uncomplicated intradermal injection of a total of 1 mCi Technetium-988mulfur colloid for purposes of sentinel node identification. Electronically Signed   By: JoSandi Mariscal.D.   On: 10/20/2020 10:47   MM Breast Surgical Specimen  Result Date: 10/20/2020 CLINICAL DATA:  Post lumpectomy specimen radiograph. EXAM: SPECIMEN RADIOGRAPH OF THE RIGHT BREAST COMPARISON:  Previous exam(s). FINDINGS: Status post excision of the right breast. The wire tip and biopsy marker clip are present and are marked for pathology. IMPRESSION: Specimen radiograph of the right breast. Electronically Signed   By: NaAudie Pinto.D.   On: 10/20/2020 14:37   MM RT PLC BREAST LOC DEV   1ST LESION  INC MAMMO GUIDE  Result Date: 10/20/2020 CLINICAL  DATA:  [age]ear old female with recently diagnosed right breast invasive carcinoma. Patient presenting for needle localization prior to same day surgery. EXAM: NEEDLE LOCALIZATION OF THE RIGHT BREAST WITH MAMMO GUIDANCE COMPARISON:  Previous exams. PROCEDURE: Patient presents for needle localization prior to right breast lumpectomy. I met with the  patient and we discussed the procedure of needle localization including benefits and alternatives. We discussed the high likelihood of a successful procedure. We discussed the risks of the procedure, including infection, bleeding, tissue injury, and further surgery. Informed, written consent was given. The usual time-out protocol was performed immediately prior to the procedure. Using mammographic guidance, sterile technique, 1% lidocaine and a 5 cm modified Kopans needle, the X shaped clip was localized using lateral approach. The images were marked for Dr. Bary Castilla. IMPRESSION: Needle localization of the right breast. No apparent complications. Electronically Signed   By: Audie Pinto M.D.   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  #I reviewed the pathology and stage with the patient and husband detail.  Given grade 1; and less than 10 mm size-Oncotype is not recommended.  I do recommend proceeding with adjuvant radiation; considering MammoSite.  Awaiting evaluation with Dr. Donella Stade.  # #Discussed the mechanism of action of aromatase inhibitors-with blocking of estrogen to prevent breast cancer.  Also discussed the potential side effects including but not limited to arthralgias hot flashes and increased risk of osteoporosis.  We will get a bone density test prior to next visit.  #Elevated blood pressure-150s over 90; recommend rechecking at home; keeping a log of blood pressure and  reviewing with PCP.  # DISPOSITION: #Radiation oncology referral-diagnosis of breast cancer # follow in 2 months-MD; BMD prior-Dr.B  All questions were answered. The patient/family knows to call the clinic with any problems, questions or concerns.   Cammie Sickle, MD 11/03/2020 10:36 AM

## 2020-11-03 NOTE — Progress Notes (Signed)
Supported patient, and husband at post-op visit.  Plans for radiation and antihormonal treatment explained explained thoroughly by Dr. Rogue Bussing.  Consult with Dr. Baruch Gouty scheduled for Thursday 11/09/20. Shown  to Radiation Dept.    Recheck of blood pressure 159/86.

## 2020-11-09 ENCOUNTER — Encounter: Payer: Self-pay | Admitting: Radiation Oncology

## 2020-11-09 ENCOUNTER — Other Ambulatory Visit: Payer: Self-pay

## 2020-11-09 ENCOUNTER — Ambulatory Visit
Admission: RE | Admit: 2020-11-09 | Discharge: 2020-11-09 | Disposition: A | Discharge status: Home / Self Care | Payer: Medicare Other | Source: Ambulatory Visit | Attending: Radiation Oncology | Admitting: Radiation Oncology

## 2020-11-09 VITALS — BP 156/89 | HR 67 | Temp 96.6°F | Wt 131.0 lb

## 2020-11-09 DIAGNOSIS — Z170 Estrogen receptor positive status [ER+]: Secondary | ICD-10-CM

## 2020-11-09 DIAGNOSIS — C50411 Malignant neoplasm of upper-outer quadrant of right female breast: Secondary | ICD-10-CM

## 2020-11-09 NOTE — Consult Note (Signed)
NEW PATIENT EVALUATION  Name: Laura Johns  MRN: [id number]  Date:   11/09/2020     DOB: Aug 28, 1951   This 69 y.o. 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  REFERRING PHYSICIAN: [name], MD  CHIEF COMPLAINT:  Chief Complaint  Patient presents with   Consult    DIAGNOSIS: There were no encounter diagnoses.   PREVIOUS INVESTIGATIONS:  Mammograms ultrasound reviewed clinical notes reviewed pathology report reviewed  HPI: Patient is a 69 year old female who presented with an abnormal mammogram showing asymmetry in the posterior outer breast on the right side warranting further evaluation.  This was confirmed on ultrasound being of 0.5 cm right breast mass that she went went on for ultrasound-guided biopsy positive for invasive mammary carcinoma.  Tumor was ER/PR positive HER-2/neu not overexpressed.  She underwent wide local excision by Dr. Tollie Pizza for a 5 mm overall grade 1 mucinous type invasive mammary carcinoma.  Margins were clear at 2 mm.  3 sentinel lymph nodes were negative for malignancy.  Patient has done well postoperatively.  She has been seen by medical oncology with recommendation for post radiation antiestrogen therapy.  Patient is a candidate for MammoSite balloon placement according to Dr. Tollie Pizza.  She is seen today for evaluation is doing well still little tender although healing well.  PLANNED TREATMENT REGIMEN: Accelerated partial breast radiation  PAST MEDICAL HISTORY:  has a past medical history of Borderline diabetes mellitus, Cancer ([place]), Diverticulitis, Hyperlipemia, and Hypertension.    PAST SURGICAL HISTORY:  Past Surgical History:  Procedure Laterality Date   ABDOMINAL HYSTERECTOMY     AXILLARY SENTINEL NODE BIOPSY Right 10/20/2020   Procedure: AXILLARY  SENTINEL NODE BIOPSY;  Surgeon: Robert Bellow, MD;  Location: [facility] ORS;  Service: General;  Laterality: Right;   BREAST BIOPSY Right 09/27/2020   GRADE I INVASIVE MAMMARY CARCINOMA   BREAST LUMPECTOMY WITH NEEDLE LOCALIZATION Right 10/20/2020   Procedure: BREAST LUMPECTOMY WITH NEEDLE LOCALIZATION;  Surgeon: Robert Bellow, MD;  Location: [facility] ORS;  Service: General;  Laterality: Right;   CATARACT EXTRACTION W/PHACO Right 08/25/2014   Procedure: CATARACT EXTRACTION PHACO AND INTRAOCULAR LENS PLACEMENT ([place]);  Surgeon: Tonny Branch, MD;  Location: [facility];  Service: Ophthalmology;  Laterality: Right;  CDE 5.82   CATARACT EXTRACTION W/PHACO Left 04/14/2020   Procedure: CATARACT EXTRACTION PHACO AND INTRAOCULAR LENS PLACEMENT LEFT EYE CDE=6.73;  Surgeon: Baruch Goldmann, MD;  Location: [facility];  Service: Ophthalmology;  Laterality: Left;  left   COLONOSCOPY WITH PROPOFOL N/A 09/05/2017   Procedure: COLONOSCOPY WITH PROPOFOL;  Surgeon: Manya Silvas, MD;  Location: [facility] ENDOSCOPY;  Service: Endoscopy;  Laterality: N/A;   ESOPHAGOGASTRODUODENOSCOPY (EGD) WITH PROPOFOL N/A 09/05/2017   Procedure: ESOPHAGOGASTRODUODENOSCOPY (EGD) WITH PROPOFOL;  Surgeon: Manya Silvas, MD;  Location: [facility] ENDOSCOPY;  Service: Endoscopy;  Laterality: N/A;    FAMILY HISTORY: family history includes Heart attack in her father; Stroke in her father and mother.  SOCIAL HISTORY:  reports that she has never smoked. She has never used smokeless tobacco. She reports that she does not drink alcohol and does not use drugs.  ALLERGIES: Atorvastatin, Lovastatin, Pravastatin, Contrast media [iodinated diagnostic agents], Morphine and related, and Nexium [esomeprazole magnesium]  MEDICATIONS:  Current Outpatient Medications  Medication Sig Dispense Refill   aspirin EC 81 MG tablet Take 81 mg by mouth  daily.     cholecalciferol (VITAMIN D3) 25 MCG (1000 UNIT) tablet Take 1,000 Units by mouth daily.     ezetimibe  (ZETIA) 10 MG tablet Take 10 mg by mouth every morning.      hydrochlorothiazide (HYDRODIURIL) 25 MG tablet Take 25 mg by mouth every morning.      lisinopril (PRINIVIL,ZESTRIL) 10 MG tablet Take 20 mg by mouth every morning.      Glycerin-Hypromellose-PEG 400 (DRY EYE RELIEF DROPS) 0.2-0.2-1 % SOLN Place 1 drop into both eyes daily as needed (Dry eye). (Patient not taking: Reported on 11/03/2020)     HYDROcodone-acetaminophen (NORCO/VICODIN) 5-325 MG tablet Take 1 tablet by mouth every 4 (four) hours as needed for moderate pain. (Patient not taking: Reported on 11/03/2020) 12 tablet 0   No current facility-administered medications for this encounter.    ECOG PERFORMANCE STATUS:  0 - Asymptomatic  REVIEW OF SYSTEMS: Patient denies any weight loss, fatigue, weakness, fever, chills or night sweats. Patient denies any loss of vision, blurred vision. Patient denies any ringing  of the ears or hearing loss. No irregular heartbeat. Patient denies heart murmur or history of fainting. Patient denies any chest pain or pain radiating to her upper extremities. Patient denies any shortness of breath, difficulty breathing at night, cough or hemoptysis. Patient denies any swelling in the lower legs. Patient denies any nausea vomiting, vomiting of blood, or coffee ground material in the vomitus. Patient denies any stomach pain. Patient states has had normal bowel movements no significant constipation or diarrhea. Patient denies any dysuria, hematuria or significant nocturia. Patient denies any problems walking, swelling in the joints or loss of balance. Patient denies any skin changes, loss of hair or loss of weight. Patient denies any excessive worrying or anxiety or significant depression. Patient denies any problems with insomnia. Patient denies excessive thirst, polyuria, polydipsia. Patient denies any swollen glands, patient denies easy bruising or easy bleeding. Patient denies any recent infections, allergies  or URI. Patient "s visual fields have not changed significantly in recent time.   PHYSICAL EXAM: BP (!) 156/89 (Patient Position: Sitting, Cuff Size: Normal)    Pulse 67    Temp (!) 96.6 F (35.9 C) (Tympanic)    Wt 131 lb (59.4 kg)    BMI 25.58 kg/m  Patient is wide local excision in her right breast which is healing well no dominant mass or nodularity is noted in either breast in 2 positions examined no axillary or supraclavicular adenopathy is appreciated.  Well-developed well-nourished patient in NAD. HEENT reveals PERLA, EOMI, discs not visualized.  Oral cavity is clear. No oral mucosal lesions are identified. Neck is clear without evidence of cervical or supraclavicular adenopathy. Lungs are clear to A&P. Cardiac examination is essentially unremarkable with regular rate and rhythm without murmur rub or thrill. Abdomen is benign with no organomegaly or masses noted. Motor sensory and DTR levels are equal and symmetric in the upper and lower extremities. Cranial nerves II through XII are grossly intact. Proprioception is intact. No peripheral adenopathy or edema is identified. No motor or sensory levels are noted. Crude visual fields are within normal range.  LABORATORY DATA: Pathology report reviewed    RADIOLOGY RESULTS: Mammogram and ultrasound reviewed   IMPRESSION: Stage Ia mucinous invasive mammary carcinoma the right breast as post wide local excision ER/PR positive in 69 year old female  PLAN: At this time patient is opted for accelerated partial breast radiation to her right breast.  Would plan on delivering 3400 cGy in 10  fractions at 340 centigrade twice daily.  Risks and benefits of treatment including skin reaction possible thickening of her lumpectomy cavity fatigue all were described in detail to the patient and her husband.  We will coordinate with Dr. Barbette Hair office placement of MammoSite balloon and set up CT simulation shortly thereafter.  Patient husband both seem to  comprehend her treatment plan well.  Patient will be a candidate for antiestrogen therapy after completion of radiation.  I would like to take this opportunity to thank you for allowing me to participate in the care of your patient.Noreene Filbert, MD

## 2020-11-30 ENCOUNTER — Ambulatory Visit
Admission: RE | Admit: 2020-11-30 | Discharge: 2020-11-30 | Disposition: A | Discharge status: Home / Self Care | Payer: Medicare Other | Source: Ambulatory Visit | Attending: Radiation Oncology | Admitting: Radiation Oncology

## 2020-11-30 DIAGNOSIS — C50411 Malignant neoplasm of upper-outer quadrant of right female breast: Secondary | ICD-10-CM | POA: Diagnosis present

## 2020-11-30 DIAGNOSIS — Z510 Encounter for antineoplastic radiation therapy: Secondary | ICD-10-CM | POA: Insufficient documentation

## 2020-12-01 ENCOUNTER — Ambulatory Visit
Admission: RE | Admit: 2020-12-01 | Discharge: 2020-12-01 | Disposition: A | Discharge status: Home / Self Care | Payer: Medicare Other | Source: Ambulatory Visit | Attending: Radiation Oncology | Admitting: Radiation Oncology

## 2020-12-01 DIAGNOSIS — C50411 Malignant neoplasm of upper-outer quadrant of right female breast: Secondary | ICD-10-CM | POA: Diagnosis not present

## 2020-12-04 ENCOUNTER — Ambulatory Visit: Payer: Medicare Other

## 2020-12-04 ENCOUNTER — Ambulatory Visit
Admission: RE | Admit: 2020-12-04 | Discharge: 2020-12-04 | Disposition: A | Discharge status: Home / Self Care | Payer: Medicare Other | Source: Ambulatory Visit | Attending: Radiation Oncology | Admitting: Radiation Oncology

## 2020-12-04 DIAGNOSIS — C50411 Malignant neoplasm of upper-outer quadrant of right female breast: Secondary | ICD-10-CM | POA: Diagnosis not present

## 2020-12-05 ENCOUNTER — Ambulatory Visit
Admission: RE | Admit: 2020-12-05 | Discharge: 2020-12-05 | Disposition: A | Discharge status: Home / Self Care | Payer: Medicare Other | Source: Ambulatory Visit | Attending: Radiation Oncology | Admitting: Radiation Oncology

## 2020-12-05 DIAGNOSIS — C50411 Malignant neoplasm of upper-outer quadrant of right female breast: Secondary | ICD-10-CM | POA: Diagnosis not present

## 2020-12-06 ENCOUNTER — Ambulatory Visit
Admission: RE | Admit: 2020-12-06 | Discharge: 2020-12-06 | Disposition: A | Discharge status: Home / Self Care | Payer: Medicare Other | Source: Ambulatory Visit | Attending: Radiation Oncology | Admitting: Radiation Oncology

## 2020-12-06 DIAGNOSIS — C50411 Malignant neoplasm of upper-outer quadrant of right female breast: Secondary | ICD-10-CM | POA: Diagnosis not present

## 2020-12-07 ENCOUNTER — Ambulatory Visit
Admission: RE | Admit: 2020-12-07 | Discharge: 2020-12-07 | Disposition: A | Discharge status: Home / Self Care | Payer: Medicare Other | Source: Ambulatory Visit | Attending: Radiation Oncology | Admitting: Radiation Oncology

## 2020-12-07 ENCOUNTER — Other Ambulatory Visit: Payer: Medicare Other

## 2020-12-07 DIAGNOSIS — C50411 Malignant neoplasm of upper-outer quadrant of right female breast: Secondary | ICD-10-CM | POA: Diagnosis not present

## 2020-12-08 ENCOUNTER — Ambulatory Visit: Payer: Medicare Other

## 2020-12-11 ENCOUNTER — Ambulatory Visit: Payer: Medicare Other

## 2020-12-11 ENCOUNTER — Other Ambulatory Visit: Payer: Self-pay

## 2020-12-11 ENCOUNTER — Ambulatory Visit
Admission: RE | Admit: 2020-12-11 | Discharge: 2020-12-11 | Disposition: A | Discharge status: Home / Self Care | Payer: Medicare Other | Source: Ambulatory Visit | Attending: Internal Medicine | Admitting: Internal Medicine

## 2020-12-11 DIAGNOSIS — Z13820 Encounter for screening for osteoporosis: Secondary | ICD-10-CM | POA: Insufficient documentation

## 2020-12-11 DIAGNOSIS — Z170 Estrogen receptor positive status [ER+]: Secondary | ICD-10-CM | POA: Diagnosis present

## 2020-12-11 DIAGNOSIS — M8589 Other specified disorders of bone density and structure, multiple sites: Secondary | ICD-10-CM | POA: Insufficient documentation

## 2020-12-11 DIAGNOSIS — Z780 Asymptomatic menopausal state: Secondary | ICD-10-CM | POA: Diagnosis not present

## 2020-12-11 DIAGNOSIS — C50411 Malignant neoplasm of upper-outer quadrant of right female breast: Secondary | ICD-10-CM | POA: Insufficient documentation

## 2020-12-12 ENCOUNTER — Ambulatory Visit: Payer: Medicare Other

## 2020-12-13 ENCOUNTER — Ambulatory Visit: Payer: Medicare Other

## 2020-12-14 ENCOUNTER — Ambulatory Visit: Payer: Medicare Other

## 2021-01-05 ENCOUNTER — Ambulatory Visit: Payer: Medicare Other | Admitting: Internal Medicine

## 2021-01-10 ENCOUNTER — Inpatient Hospital Stay: Payer: Medicare Other | Attending: Internal Medicine | Admitting: Internal Medicine

## 2021-01-10 ENCOUNTER — Other Ambulatory Visit: Payer: Self-pay

## 2021-01-10 VITALS — BP 135/89 | HR 77 | Temp 98.5°F | Resp 16 | Wt 130.0 lb

## 2021-01-10 DIAGNOSIS — C50411 Malignant neoplasm of upper-outer quadrant of right female breast: Secondary | ICD-10-CM | POA: Insufficient documentation

## 2021-01-10 DIAGNOSIS — I10 Essential (primary) hypertension: Secondary | ICD-10-CM | POA: Diagnosis not present

## 2021-01-10 DIAGNOSIS — E785 Hyperlipidemia, unspecified: Secondary | ICD-10-CM | POA: Insufficient documentation

## 2021-01-10 DIAGNOSIS — Z170 Estrogen receptor positive status [ER+]: Secondary | ICD-10-CM | POA: Diagnosis not present

## 2021-01-10 DIAGNOSIS — M8580 Other specified disorders of bone density and structure, unspecified site: Secondary | ICD-10-CM | POA: Diagnosis not present

## 2021-01-10 DIAGNOSIS — Z923 Personal history of irradiation: Secondary | ICD-10-CM | POA: Diagnosis not present

## 2021-01-10 DIAGNOSIS — Z79899 Other long term (current) drug therapy: Secondary | ICD-10-CM | POA: Insufficient documentation

## 2021-01-10 DIAGNOSIS — Z79811 Long term (current) use of aromatase inhibitors: Secondary | ICD-10-CM | POA: Insufficient documentation

## 2021-01-10 MED ORDER — ANASTROZOLE 1 MG PO TABS: 1.0000 mg | ORAL_TABLET | Freq: Every day | ORAL | 1 refills | Status: DC

## 2021-01-10 NOTE — Assessment & Plan Note (Addendum)
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 S/p Mammosite RT. NO adjuvant chemo.  Proceed with adjuvant anastrozole.   #I again reviewed mechanism of action of aromatase inhibitors-with blocking of estrogen to prevent breast cancer.  Also discussed the potential side effects including but not limited to arthralgias hot flashes and increased risk of osteoporosis [see below].  #December 2021-OSTEOPENIA-  T-score of-1.8. [2018-hypercalemia]; continue on the- vit D1000/day; exercise; hold off bisphosphonates; recheck BMD in 2 years.   # DISPOSITION: # follow up in 6 weeks; MD; labs- cbc/bmp-Dr.B

## 2021-01-10 NOTE — Progress Notes (Signed)
[facility] [place] NOTE  Patient Care Team: Laura Body, MD as PCP - General (Family Medicine) Laura Demark, RN as Oncology Nurse Navigator (Oncology)  CHIEF COMPLAINTS/PURPOSE OF CONSULTATION: Breast cancer  #  Oncology History Overview Note  # RIGHT BREAST MUCINOUS CARCINOMA: ER/PR-POSITIVE; Her 2 NEG. PT1apN0 [ 19m in Biopsy; 3 mm in final resection]; G-1. Dr.Byrnett; s/p MammoSite.  # MID JAN 2022- START ARIMIDEX  # DEC 2021- BMD- t score -1.8/osteopenia   # SURVIVORSHIP:   # GENETICS:   DIAGNOSIS: Right breast cancer  STAGE:   1      ;  GOALS: Cure  CURRENT/MOST RECENT THERAPY : Radiation; AI    Carcinoma of upper-outer quadrant of right breast in female, estrogen receptor positive ([facility]  10/06/2020 Initial Diagnosis   Carcinoma of upper-outer quadrant of right breast in female, estrogen receptor positive ([place] [facility]   01/10/2021 Cancer Staging   Staging form: Breast, AJCC 8th Edition - Clinical: Stage IA (cT1b, cN0, cM0, G1, ER+, PR+, HER2-) - Signed by BCammie Sickle MD on 01/10/2021    HISTORY OF PRESENTING ILLNESS:  Laura Sales89y.o.  female patient with right breast cancer ER/PR positive HER-2 negative is here for follow-up/review results of the bone density test.  Patient interim underwent MammoSite/radiation therapy.  Patient is recovering well.  Denies any fevers or chills.  No nausea or vomiting.  Review of Systems  Constitutional: Negative for chills, diaphoresis, fever, malaise/fatigue and weight loss.  HENT: Negative for nosebleeds and sore throat.   Eyes: Negative for double vision.  Respiratory: Negative for cough, hemoptysis, sputum production, shortness of breath and wheezing.   Cardiovascular: Negative for chest pain, palpitations, orthopnea and leg swelling.  Gastrointestinal: Negative for abdominal pain, blood in stool, constipation, diarrhea, heartburn, melena, nausea and vomiting.  Genitourinary: Negative for  dysuria, frequency and urgency.  Musculoskeletal: Positive for back pain and joint pain.  Skin: Negative.  Negative for itching and rash.  Neurological: Negative for dizziness, tingling, focal weakness, weakness and headaches.  Endo/Heme/Allergies: Does not bruise/bleed easily.  Psychiatric/Behavioral: Negative for depression. The patient is not nervous/anxious and does not have insomnia.      MEDICAL HISTORY:  Past Medical History:  Diagnosis Date  . Borderline diabetes mellitus   . Cancer ([facility]   . Diverticulitis   . Hyperlipemia   . Hypertension     SURGICAL HISTORY: Past Surgical History:  Procedure Laterality Date  . ABDOMINAL HYSTERECTOMY    . AXILLARY SENTINEL NODE BIOPSY Right 10/20/2020   Procedure: AXILLARY SENTINEL NODE BIOPSY;  Surgeon: BRobert Bellow MD;  Location: [facility] ORS;  Service: General;  Laterality: Right;  . BREAST BIOPSY Right 09/27/2020   GRADE I INVASIVE MAMMARY CARCINOMA  . BREAST LUMPECTOMY WITH NEEDLE LOCALIZATION Right 10/20/2020   Procedure: BREAST LUMPECTOMY WITH NEEDLE LOCALIZATION;  Surgeon: BRobert Bellow MD;  Location: [facility] ORS;  Service: General;  Laterality: Right;  . CATARACT EXTRACTION W/PHACO Right 08/25/2014   Procedure: CATARACT EXTRACTION PHACO AND INTRAOCULAR LENS PLACEMENT ([facility]);  Surgeon: KTonny Branch MD;  Location: [facility];  Service: Ophthalmology;  Laterality: Right;  CDE 5.82  . CATARACT EXTRACTION W/PHACO Left 04/14/2020   Procedure: CATARACT EXTRACTION PHACO AND INTRAOCULAR LENS PLACEMENT LEFT EYE CDE=6.73;  Surgeon: WBaruch Goldmann MD;  Location: [facility];  Service: Ophthalmology;  Laterality: Left;  left  . COLONOSCOPY WITH PROPOFOL N/A 09/05/2017   Procedure: COLONOSCOPY WITH PROPOFOL;  Surgeon: EManya Silvas MD;  [facility];  Service:  Endoscopy;  Laterality: N/A;  . ESOPHAGOGASTRODUODENOSCOPY (EGD) WITH PROPOFOL N/A 09/05/2017   Procedure: ESOPHAGOGASTRODUODENOSCOPY (EGD) WITH PROPOFOL;  Surgeon: Elliott,  Robert T, MD;  Location: [facility] ENDOSCOPY;  Service: Endoscopy;  Laterality: N/A;    SOCIAL HISTORY: Social History   Socioeconomic History  . Marital status: Married    Spouse name: Not on file  . Number of children: Not on file  . Years of education: Not on file  . Highest education level: Not on file  Occupational History  . Not on file  Tobacco Use  . Smoking status: Never Smoker  . Smokeless tobacco: Never Used  Vaping Use  . Vaping Use: Never used  Substance and Sexual Activity  . Alcohol use: No  . Drug use: No  . Sexual activity: Yes  Other Topics Concern  . 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 Lives in [place]; [place] address. With husband at home; one daughter- 50 y.    Social Determinants of Health   Financial Resource Strain: Not on file  Food Insecurity: Not on file  Transportation Needs: Not on file  Physical Activity: Not on file  Stress: Not on file  Social Connections: Not on file  Intimate Partner Violence: Not on file    FAMILY HISTORY: Family History  Problem Relation Age of Onset  . Stroke Mother   . Heart attack Father   . Stroke Father   . Breast cancer Neg Hx     ALLERGIES:  is allergic to atorvastatin, lovastatin, pravastatin, contrast media [iodinated diagnostic agents], morphine and related, and nexium [esomeprazole magnesium].  MEDICATIONS:  Current Outpatient Medications  Medication Sig Dispense Refill  . anastrozole (ARIMIDEX) 1 MG tablet Take 1 tablet (1 mg total) by mouth daily. 90 tablet 1  . aspirin EC 81 MG tablet Take 81 mg by mouth daily.    . cholecalciferol (VITAMIN D3) 25 MCG (1000 UNIT) tablet Take 1,000 Units by mouth daily.    . ezetimibe (ZETIA) 10 MG tablet Take 10 mg by mouth every morning.     . hydrochlorothiazide (HYDRODIURIL) 25 MG tablet Take 25 mg by mouth every morning.     . lisinopril (PRINIVIL,ZESTRIL) 10 MG tablet Take 20 mg by  mouth every morning.     . predniSONE (DELTASONE) 10 MG tablet 6-day taper; 6 pills day one, 5 pills day 2, 4 pills day 3, etc. Follow package instructions.    . Glycerin-Hypromellose-PEG 400 (DRY EYE RELIEF DROPS) 0.2-0.2-1 % SOLN Place 1 drop into both eyes daily as needed (Dry eye). (Patient not taking: No sig reported)    . HYDROcodone-acetaminophen (NORCO/VICODIN) 5-325 MG tablet Take 1 tablet by mouth every 4 (four) hours as needed for moderate pain. (Patient not taking: No sig reported) 12 tablet 0   No current facility-administered medications for this visit.      .  PHYSICAL EXAMINATION: ECOG PERFORMANCE STATUS: 0 - Asymptomatic  Vitals:   01/10/21 1105  BP: 135/89  Pulse: 77  Resp: 16  Temp: 98.5 F (36.9 C)  SpO2: 99%   Filed Weights   01/10/21 1105  Weight: 130 lb (59 kg)    Physical Exam HENT:     Head: Normocephalic and atraumatic.     Mouth/Throat:     Pharynx: No oropharyngeal exudate.  Eyes:     Pupils: Pupils are equal, round, and reactive to light.  Cardiovascular:     Rate and Rhythm: Normal   rate and regular rhythm.  Pulmonary:     Effort: No respiratory distress.     Breath sounds: No wheezing.  Abdominal:     General: Bowel sounds are normal. There is no distension.     Palpations: Abdomen is soft. There is no mass.     Tenderness: There is no abdominal tenderness. There is no guarding or rebound.  Musculoskeletal:        General: No tenderness. Normal range of motion.     Cervical back: Normal range of motion and neck supple.  Skin:    General: Skin is warm.  Neurological:     Mental Status: She is alert and oriented to person, place, and time.  Psychiatric:        Mood and Affect: Affect normal.      LABORATORY DATA:  I have reviewed the data as listed Lab Results  Component Value Date   WBC 3.9 (L) 04/12/2020   HGB 13.4 04/12/2020   HCT 43.2 04/12/2020   MCV 81.4 04/12/2020   PLT 318 04/12/2020   Recent Labs     04/12/20 1002 10/18/20 0908  NA 139  --   K 4.3 3.7  CL 102  --   CO2 27  --   GLUCOSE 86  --   BUN 21  --   CREATININE 0.96  --   CALCIUM 10.2  --   GFRNONAA >60  --   GFRAA >60  --     RADIOGRAPHIC STUDIES: I have personally reviewed the radiological images as listed and agreed with the findings in the report. No results found.  ASSESSMENT & PLAN:   Carcinoma of upper-outer quadrant of right breast in female, estrogen receptor positive ([facility]) # MUCINOUS RIGHT BREAST CANCER- STAGE I ER/PR-POSITIVE; HER-2 NEGATIVE. S/p Mammosite RT. NO adjuvant chemo.  Proceed with adjuvant anastrozole.   #I again reviewed mechanism of action of aromatase inhibitors-with blocking of estrogen to prevent breast cancer.  Also discussed the potential side effects including but not limited to arthralgias hot flashes and increased risk of osteoporosis [see below].  #December 2021-OSTEOPENIA-  T-score of-1.8. [2018-hypercalemia]; continue on the- vit D1000/day; exercise; hold off bisphosphonates; recheck BMD in 2 years.   # DISPOSITION: # follow up in 6 weeks; MD; labs- cbc/bmp-Dr.B   All questions were answered. The patient/family knows to call the clinic with any problems, questions or concerns.   Laura R Brahmanday, MD 01/10/2021 3:08 PM    

## 2021-01-11 ENCOUNTER — Ambulatory Visit
Admission: RE | Admit: 2021-01-11 | Discharge: 2021-01-11 | Disposition: A | Discharge status: Home / Self Care | Payer: Medicare Other | Source: Ambulatory Visit | Attending: Radiation Oncology | Admitting: Radiation Oncology

## 2021-01-11 ENCOUNTER — Other Ambulatory Visit: Payer: Self-pay

## 2021-01-11 ENCOUNTER — Encounter: Payer: Self-pay | Admitting: Radiation Oncology

## 2021-01-11 DIAGNOSIS — Z170 Estrogen receptor positive status [ER+]: Secondary | ICD-10-CM | POA: Insufficient documentation

## 2021-01-11 DIAGNOSIS — C50411 Malignant neoplasm of upper-outer quadrant of right female breast: Secondary | ICD-10-CM | POA: Diagnosis not present

## 2021-01-11 DIAGNOSIS — Z923 Personal history of irradiation: Secondary | ICD-10-CM | POA: Diagnosis not present

## 2021-01-11 DIAGNOSIS — Z79811 Long term (current) use of aromatase inhibitors: Secondary | ICD-10-CM | POA: Insufficient documentation

## 2021-01-11 NOTE — Progress Notes (Signed)
Radiation Oncology Follow up Note  Name: Laura Johns   Date:   01/11/2021 MRN:  [id number] DOB: 1951/06/20    This 70 y.o. female presents to the clinic today for 1 month follow-up status post accelerated partial breast radiation.  To her right breast for ER/PR positive invasive mammary carcinoma status post wide local excision  REFERRING PROVIDER: [name], MD  HPI: Patient is a 70 year old female now about 1 month having completed accelerated partial breast radiation to her right breast for stage Ia invasive mammary carcinoma with mucinous features ER/PR positive status post wide local excision seen today in routine follow-up she is doing well.  She specifically denies breast tenderness cough or bone pain..  She has been started on Arimidex tolerating it well without side effect.  COMPLICATIONS OF TREATMENT: none  FOLLOW UP COMPLIANCE: keeps appointments   PHYSICAL EXAM:  BP (!) (P) 157/91 (BP Location: Left Arm, Patient Position: Sitting)   Pulse (P) 70   Temp (!) (P) 97.2 F (36.2 C) (Tympanic)   Resp (P) 16   Wt (P) 130 lb 11.2 oz (59.3 kg)   BMI (P) 25.53 kg/m  Lungs are clear to A&P cardiac examination essentially unremarkable with regular rate and rhythm. No dominant mass or nodularity is noted in either breast in 2 positions examined. Incision is well-healed. No axillary or supraclavicular adenopathy is appreciated. Cosmetic result is excellent.  Well-developed well-nourished patient in NAD. HEENT reveals PERLA, EOMI, discs not visualized.  Oral cavity is clear. No oral mucosal lesions are identified. Neck is clear without evidence of cervical or supraclavicular adenopathy. Lungs are clear to A&P. Cardiac examination is essentially unremarkable with regular rate and rhythm without murmur rub or thrill. Abdomen is benign with no organomegaly or masses noted. Motor sensory and DTR levels are equal and symmetric in the upper and lower extremities. Cranial nerves II through  XII are grossly intact. Proprioception is intact. No peripheral adenopathy or edema is identified. No motor or sensory levels are noted. Crude visual fields are within normal range.  RADIOLOGY RESULTS: No current films to review  PLAN: Present time patient is doing well 1 month out from accelerated partial breast radiation and pleased with her overall progress.  She continues on Arimidex.  I am asked to see her back in 4 to 5 months for follow-up.  Patient is to call with any concerns.  I would like to take this opportunity to thank you for allowing me to participate in the care of your patient.Noreene Filbert, MD

## 2021-02-15 DIAGNOSIS — Z853 Personal history of malignant neoplasm of breast: Secondary | ICD-10-CM | POA: Insufficient documentation

## 2021-02-21 ENCOUNTER — Inpatient Hospital Stay: Payer: Medicare Other | Admitting: Internal Medicine

## 2021-02-21 ENCOUNTER — Other Ambulatory Visit: Payer: Self-pay

## 2021-02-21 ENCOUNTER — Encounter: Payer: Self-pay | Admitting: Internal Medicine

## 2021-02-21 ENCOUNTER — Inpatient Hospital Stay: Payer: Medicare Other | Attending: Internal Medicine

## 2021-02-21 DIAGNOSIS — C50411 Malignant neoplasm of upper-outer quadrant of right female breast: Secondary | ICD-10-CM | POA: Insufficient documentation

## 2021-02-21 DIAGNOSIS — E119 Type 2 diabetes mellitus without complications: Secondary | ICD-10-CM | POA: Diagnosis not present

## 2021-02-21 DIAGNOSIS — Z79811 Long term (current) use of aromatase inhibitors: Secondary | ICD-10-CM | POA: Diagnosis not present

## 2021-02-21 DIAGNOSIS — R232 Flushing: Secondary | ICD-10-CM | POA: Insufficient documentation

## 2021-02-21 DIAGNOSIS — Z170 Estrogen receptor positive status [ER+]: Secondary | ICD-10-CM

## 2021-02-21 DIAGNOSIS — N951 Menopausal and female climacteric states: Secondary | ICD-10-CM | POA: Insufficient documentation

## 2021-02-21 DIAGNOSIS — I10 Essential (primary) hypertension: Secondary | ICD-10-CM | POA: Diagnosis not present

## 2021-02-21 DIAGNOSIS — E785 Hyperlipidemia, unspecified: Secondary | ICD-10-CM | POA: Insufficient documentation

## 2021-02-21 DIAGNOSIS — M8580 Other specified disorders of bone density and structure, unspecified site: Secondary | ICD-10-CM | POA: Insufficient documentation

## 2021-02-21 DIAGNOSIS — Z79899 Other long term (current) drug therapy: Secondary | ICD-10-CM | POA: Insufficient documentation

## 2021-02-21 LAB — BASIC METABOLIC PANEL
Anion gap: 10 (ref 5–15)
BUN: 15 mg/dL (ref 8–23)
CO2: 28 mmol/L (ref 22–32)
Calcium: 9.9 mg/dL (ref 8.9–10.3)
Chloride: 101 mmol/L (ref 98–111)
Creatinine, Ser: 1.01 mg/dL — ABNORMAL HIGH (ref 0.44–1.00)
GFR, Estimated: 60 mL/min — ABNORMAL LOW (ref >60)
Glucose, Bld: 100 mg/dL — ABNORMAL HIGH (ref 70–99)
Potassium: 3.6 mmol/L (ref 3.5–5.1)
Sodium: 139 mmol/L (ref 135–145)

## 2021-02-21 LAB — CBC WITH DIFFERENTIAL/PLATELET
Abs Immature Granulocytes: 0.02 10*3/uL (ref 0.00–0.07)
Basophils Absolute: 0 10*3/uL (ref 0.0–0.1)
Basophils Relative: 1 %
Eosinophils Absolute: 0.2 10*3/uL (ref 0.0–0.5)
Eosinophils Relative: 4 %
HCT: 45 % (ref 36.0–46.0)
Hemoglobin: 14.3 g/dL (ref 12.0–15.0)
Immature Granulocytes: 1 %
Lymphocytes Relative: 31 %
Lymphs Abs: 1.3 10*3/uL (ref 0.7–4.0)
MCH: 25.6 pg — ABNORMAL LOW (ref 26.0–34.0)
MCHC: 31.8 g/dL (ref 30.0–36.0)
MCV: 80.6 fL (ref 80.0–100.0)
Monocytes Absolute: 0.3 10*3/uL (ref 0.1–1.0)
Monocytes Relative: 8 %
Neutro Abs: 2.2 10*3/uL (ref 1.7–7.7)
Neutrophils Relative %: 55 %
Platelets: 263 10*3/uL (ref 150–400)
RBC: 5.58 MIL/uL — ABNORMAL HIGH (ref 3.87–5.11)
RDW: 14.7 % (ref 11.5–15.5)
WBC: 4.1 10*3/uL (ref 4.0–10.5)
nRBC: 0 % (ref 0.0–0.2)

## 2021-02-21 NOTE — Assessment & Plan Note (Addendum)
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 S/p Mammosite RT. NO adjuvant chemo.  ON  adjuvant anastrozole.   #Tolerating anastrozole well.  No major side effects noted except for mild hot flashes.  Recommended total of 5 years.  # Hot flashes- G-1; monitor for now.  # HTN- 157/85; recheck BP at home.  Monitor for now.  #December 2021-OSTEOPENIA-  T-score of-1.8. [2018-hypercalemia]; continue on the- vit D1000/day; exercise; hold off bisphosphonates; recheck BMD in 2 years.    # DISPOSITION: # follow up in 3 month MD; No labs--Dr.B   

## 2021-02-21 NOTE — Progress Notes (Signed)
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 Dr.Byrnett; s/p MammoSite.  # MID JAN 2022- START ARIMIDEX  # DEC 2021- BMD- t score -1.8/osteopenia   # SURVIVORSHIP:   # GENETICS:   DIAGNOSIS: Right breast cancer  STAGE:   1      ;  GOALS: Cure  CURRENT/MOST RECENT THERAPY : Radiation; AI    Carcinoma of upper-outer quadrant of right breast in female, estrogen receptor positive ([place]  10/06/2020 Initial Diagnosis   Carcinoma of upper-outer quadrant of right breast in female, estrogen receptor positive ([facility]   01/10/2021 Cancer Staging   Staging form: Breast, AJCC 8th Edition - Clinical: Stage IA (cT1b, cN0, cM0, G1, ER+, PR+, HER2-) - Signed by BCammie Sickle MD on 01/10/2021    HISTORY OF PRESENTING ILLNESS:  [name]  female patient with right breast cancer ER/PR positive HER-2 negative is here for follow-up.  Patient is currently on aromatase inhibitor for the last 6 weeks.  Denies any worsening joint pains or bone pain.  Complains of mild hot flashes.  No nausea no vomiting.     Review of Systems  Constitutional: Negative for chills, diaphoresis, fever, malaise/fatigue and weight loss.  HENT: Negative for nosebleeds and sore throat.   Eyes: Negative for double vision.  Respiratory: Negative for cough, hemoptysis, sputum production, shortness of breath and wheezing.   Cardiovascular: Negative for chest pain, palpitations, orthopnea and leg swelling.  Gastrointestinal: Negative for abdominal pain, blood in stool, constipation, diarrhea, heartburn, melena, nausea and vomiting.  Genitourinary: Negative for dysuria,  frequency and urgency.  Musculoskeletal: Positive for back pain and joint pain.  Skin: Negative.  Negative for itching and rash.  Neurological: Negative for dizziness, tingling, focal weakness, weakness and headaches.  Endo/Heme/Allergies: Does not bruise/bleed easily.  Psychiatric/Behavioral: Negative for depression. The patient is not nervous/anxious and does not have insomnia.      MEDICAL HISTORY:  Past Medical History:  Diagnosis Date  . Borderline diabetes mellitus   . Cancer ([place]   . Diverticulitis   . Hyperlipemia   . Hypertension     SURGICAL HISTORY: Past Surgical History:  Procedure Laterality Date  . ABDOMINAL HYSTERECTOMY    . AXILLARY SENTINEL NODE BIOPSY Right 10/20/2020   Procedure: AXILLARY SENTINEL NODE BIOPSY;  Surgeon: BRobert Bellow MD;  Location: [facility] ORS;  Service: General;  Laterality: Right;  . BREAST BIOPSY Right 09/27/2020   GRADE I INVASIVE MAMMARY CARCINOMA  . BREAST LUMPECTOMY WITH NEEDLE LOCALIZATION Right 10/20/2020   Procedure: BREAST LUMPECTOMY WITH NEEDLE LOCALIZATION;  Surgeon: BRobert Bellow MD;  Location: [facility] ORS;  Service: General;  Laterality: Right;  . CATARACT EXTRACTION W/PHACO Right 08/25/2014   Procedure: CATARACT EXTRACTION PHACO AND INTRAOCULAR LENS PLACEMENT (IOC);  Surgeon: KTonny Branch MD;  Location: [facility];  Service: Ophthalmology;  Laterality: Right;  CDE 5.82  . CATARACT EXTRACTION W/PHACO Left 04/14/2020   Procedure: CATARACT EXTRACTION PHACO AND INTRAOCULAR LENS PLACEMENT LEFT EYE CDE=6.73;  Surgeon: WBaruch Goldmann MD;  Location: [facility];  Service: Ophthalmology;  Laterality: Left;  left  . COLONOSCOPY WITH PROPOFOL N/A 09/05/2017   Procedure: COLONOSCOPY WITH PROPOFOL;  Surgeon: EManya Silvas  MD;  Location: [facility] ENDOSCOPY;  Service: Endoscopy;  Laterality: N/A;  . ESOPHAGOGASTRODUODENOSCOPY (EGD) WITH PROPOFOL N/A 09/05/2017   Procedure: ESOPHAGOGASTRODUODENOSCOPY (EGD) WITH PROPOFOL;  Surgeon: Manya Silvas, MD;   Location: [facility] ENDOSCOPY;  Service: Endoscopy;  Laterality: N/A;    SOCIAL HISTORY: Social History   Socioeconomic History  . Marital status: Married    Spouse name: Not on file  . Number of children: Not on file  . Years of education: Not on file  . Highest education level: Not on file  Occupational History  . Not on file  Tobacco Use  . Smoking status: Never Smoker  . Smokeless tobacco: Never Used  Vaping Use  . Vaping Use: Never used  Substance and Sexual Activity  . Alcohol use: No  . Drug use: No  . Sexual activity: Yes  Other Topics Concern  . Not on file  Social History Narrative   Never smoked; no alcohol; works in Scientist, research (medical). Lives in [place]; [place] address. With husband at home; one daughter- 21 y.    Social Determinants of Health   Financial Resource Strain: Not on file  Food Insecurity: Not on file  Transportation Needs: Not on file  Physical Activity: Not on file  Stress: Not on file  Social Connections: Not on file  Intimate Partner Violence: Not on file    FAMILY HISTORY: Family History  Problem Relation Age of Onset  . Stroke Mother   . Heart attack Father   . Stroke Father   . Breast cancer Neg Hx     ALLERGIES:  is allergic to atorvastatin, lovastatin, pravastatin, contrast media [iodinated diagnostic agents], morphine and related, and nexium [esomeprazole magnesium].  MEDICATIONS:  Current Outpatient Medications  Medication Sig Dispense Refill  . anastrozole (ARIMIDEX) 1 MG tablet Take 1 tablet (1 mg total) by mouth daily. 90 tablet 1  . aspirin EC 81 MG tablet Take 81 mg by mouth daily.    . cholecalciferol (VITAMIN D3) 25 MCG (1000 UNIT) tablet Take 1,000 Units by mouth daily.    Marland Kitchen ezetimibe (ZETIA) 10 MG tablet Take 10 mg by mouth every morning.     . Glycerin-Hypromellose-PEG 400 (DRY EYE RELIEF DROPS) 0.2-0.2-1 % SOLN Place 1 drop into both eyes daily as needed (Dry eye).    . hydrochlorothiazide  (HYDRODIURIL) 25 MG tablet Take 25 mg by mouth every morning.     Marland Kitchen lisinopril (ZESTRIL) 30 MG tablet Take by mouth.     No current facility-administered medications for this visit.      Marland Kitchen  PHYSICAL EXAMINATION: ECOG PERFORMANCE STATUS: 0 - Asymptomatic  Vitals:   02/21/21 0957  BP: (!) 157/85  Pulse: 71  Resp: 16  Temp: (!) 97.1 F (36.2 C)  SpO2: 100%   Filed Weights   02/21/21 0957  Weight: 130 lb 6.4 oz (59.1 kg)    Physical Exam HENT:     Head: Normocephalic and atraumatic.     Mouth/Throat:     Pharynx: No oropharyngeal exudate.  Eyes:     Pupils: Pupils are equal, round, and reactive to light.  Cardiovascular:     Rate and Rhythm: Normal rate and regular rhythm.  Pulmonary:     Effort: No respiratory distress.     Breath sounds: No wheezing.  Abdominal:     General: Bowel sounds are normal. There is no distension.     Palpations: Abdomen is soft. There is no mass.     Tenderness: There is no  abdominal tenderness. There is no guarding or rebound.  Musculoskeletal:        General: No tenderness. Normal range of motion.     Cervical back: Normal range of motion and neck supple.  Skin:    General: Skin is warm.  Neurological:     Mental Status: She is alert and oriented to person, place, and time.  Psychiatric:        Mood and Affect: Affect normal.      LABORATORY DATA:  I have reviewed the data as listed Lab Results  Component Value Date   WBC 4.1 02/21/2021   HGB 14.3 02/21/2021   HCT 45.0 02/21/2021   MCV 80.6 02/21/2021   PLT 263 02/21/2021   Recent Labs    04/12/20 1002 10/18/20 0908 02/21/21 0941  NA 139  --  139  K 4.3 3.7 3.6  CL 102  --  101  CO2 27  --  28  GLUCOSE 86  --  100*  BUN 21  --  15  CREATININE 0.96  --  1.01*  CALCIUM 10.2  --  9.9  GFRNONAA >60  --  60*  GFRAA >60  --   --     RADIOGRAPHIC STUDIES: I have personally reviewed the radiological images as listed and agreed with the findings in the report. No  results found.  ASSESSMENT & PLAN:   Carcinoma of upper-outer quadrant of right breast in female, estrogen receptor positive ([place]) # MUCINOUS RIGHT BREAST CANCER- STAGE I ER/PR-POSITIVE; HER-2 NEGATIVE. S/p Mammosite RT. NO adjuvant chemo.  ON  adjuvant anastrozole.   #Tolerating anastrozole well.  No major side effects noted except for mild hot flashes.  Recommended total of 5 years.  # Hot flashes- G-1; monitor for now.  # HTN- 157/85; recheck BP at home.  Monitor for now.  #December 2021-OSTEOPENIA-  T-score of-1.8. [2018-hypercalemia]; continue on the- vit D1000/day; exercise; hold off bisphosphonates; recheck BMD in 2 years.    # DISPOSITION: # follow up in 3 month MD; No labs--Dr.B   All questions were answered. The patient/family knows to call the clinic with any problems, questions or concerns.   Cammie Sickle, MD 02/21/2021 11:36 AM

## 2021-05-23 ENCOUNTER — Encounter: Payer: Self-pay | Admitting: Internal Medicine

## 2021-05-23 ENCOUNTER — Inpatient Hospital Stay: Payer: Medicare Other | Attending: Internal Medicine | Admitting: Internal Medicine

## 2021-05-23 DIAGNOSIS — M8580 Other specified disorders of bone density and structure, unspecified site: Secondary | ICD-10-CM | POA: Insufficient documentation

## 2021-05-23 DIAGNOSIS — E785 Hyperlipidemia, unspecified: Secondary | ICD-10-CM | POA: Diagnosis not present

## 2021-05-23 DIAGNOSIS — I10 Essential (primary) hypertension: Secondary | ICD-10-CM | POA: Diagnosis not present

## 2021-05-23 DIAGNOSIS — N951 Menopausal and female climacteric states: Secondary | ICD-10-CM | POA: Diagnosis not present

## 2021-05-23 DIAGNOSIS — Z79811 Long term (current) use of aromatase inhibitors: Secondary | ICD-10-CM | POA: Diagnosis not present

## 2021-05-23 DIAGNOSIS — Z170 Estrogen receptor positive status [ER+]: Secondary | ICD-10-CM | POA: Diagnosis not present

## 2021-05-23 DIAGNOSIS — C50411 Malignant neoplasm of upper-outer quadrant of right female breast: Secondary | ICD-10-CM

## 2021-05-23 DIAGNOSIS — Z79899 Other long term (current) drug therapy: Secondary | ICD-10-CM | POA: Diagnosis not present

## 2021-05-23 DIAGNOSIS — R232 Flushing: Secondary | ICD-10-CM | POA: Insufficient documentation

## 2021-05-23 NOTE — Progress Notes (Signed)
Survivorship Care Plan visit completed.  Treatment summary reviewed and given to patient.  ASCO answers booklet reviewed and given to patient.  CARE program and Cancer Transitions discussed with patient along with other resources cancer center offers to patients and caregivers.  Patient verbalized understanding.    

## 2021-05-23 NOTE — Progress Notes (Signed)
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 Dr.Byrnett; s/p MammoSite.  # MID JAN 2022- START ARIMIDEX  # DEC 2021- BMD- t score -1.8/osteopenia   # SURVIVORSHIP:   # GENETICS:   DIAGNOSIS: Right breast cancer  STAGE:   1      ;  GOALS: Cure  CURRENT/MOST RECENT THERAPY : Radiation; AI    Carcinoma of upper-outer quadrant of right breast in female, estrogen receptor positive ([facility]  10/06/2020 Initial Diagnosis   Carcinoma of upper-outer quadrant of right breast in female, estrogen receptor positive ([facility]   01/10/2021 Cancer Staging   Staging form: Breast, AJCC 8th Edition - Clinical: Stage IA (cT1b, cN0, cM0, G1, ER+, PR+, HER2-) - Signed by BCammie Sickle MD on 01/10/2021    HISTORY OF PRESENTING ILLNESS:  Laura Sales89y.o.  female patient with right breast cancer ER/PR positive HER-2 negative currently on adjuvant anastrozole is here for follow-up.  Patient admits to mild hot flashes.  Complains of mild joint pains.  Otherwise denies any nausea vomiting abdominal pain.  Denies any new lumps or bumps.   Review of Systems  Constitutional: Negative for chills, diaphoresis, fever, malaise/fatigue and weight loss.  HENT: Negative for nosebleeds and sore throat.   Eyes: Negative for double vision.  Respiratory: Negative for cough, hemoptysis, sputum production, shortness of breath and wheezing.   Cardiovascular: Negative for  chest pain, palpitations, orthopnea and leg swelling.  Gastrointestinal: Negative for abdominal pain, blood in stool, constipation, diarrhea, heartburn, melena, nausea and vomiting.  Genitourinary: Negative for dysuria, frequency and urgency.  Musculoskeletal: Positive for back pain and joint pain.  Skin: Negative.  Negative for itching and rash.  Neurological: Negative for dizziness, tingling, focal weakness, weakness and headaches.  Endo/Heme/Allergies: Does not bruise/bleed easily.  Psychiatric/Behavioral: Negative for depression. The patient is not nervous/anxious and does not have insomnia.      MEDICAL HISTORY:  Past Medical History:  Diagnosis Date  . Borderline diabetes mellitus   . Cancer ([place]   . Diverticulitis   . Hyperlipemia   . Hypertension     SURGICAL HISTORY: Past Surgical History:  Procedure Laterality Date  . ABDOMINAL HYSTERECTOMY    . AXILLARY SENTINEL NODE BIOPSY Right 10/20/2020   Procedure: AXILLARY SENTINEL NODE BIOPSY;  Surgeon: BRobert Bellow MD;  Location: [facility] ORS;  Service: General;  Laterality: Right;  . BREAST BIOPSY Right 09/27/2020   GRADE I INVASIVE MAMMARY CARCINOMA  . BREAST LUMPECTOMY WITH NEEDLE LOCALIZATION Right 10/20/2020   Procedure: BREAST LUMPECTOMY WITH NEEDLE LOCALIZATION;  Surgeon: BRobert Bellow MD;  Location: [facility] ORS;  Service: General;  Laterality: Right;  . CATARACT EXTRACTION W/PHACO Right 08/25/2014   Procedure: CATARACT EXTRACTION PHACO AND INTRAOCULAR LENS PLACEMENT (IOC);  Surgeon: KTonny Branch MD;  Location: [facility];  Service: Ophthalmology;  Laterality: Right;  CDE 5.82  . CATARACT EXTRACTION W/PHACO Left 04/14/2020   Procedure: CATARACT EXTRACTION PHACO AND INTRAOCULAR LENS PLACEMENT LEFT EYE CDE=6.73;  Surgeon: WBaruch Goldmann MD;  Location: [facility];  Service:  Ophthalmology;  Laterality: Left;  left  . COLONOSCOPY WITH PROPOFOL N/A 09/05/2017   Procedure: COLONOSCOPY WITH PROPOFOL;  Surgeon: Manya Silvas, MD;   Location[facility] ENDOSCOPY;  Service: Endoscopy;  Laterality: N/A;  . ESOPHAGOGASTRODUODENOSCOPY (EGD) WITH PROPOFOL N/A 09/05/2017   Procedure: ESOPHAGOGASTRODUODENOSCOPY (EGD) WITH PROPOFOL;  Surgeon: Manya Silvas, MD;  Location: [facility] ENDOSCOPY;  Service: Endoscopy;  Laterality: N/A;    SOCIAL HISTORY: Social History   Socioeconomic History  . Marital status: Married    Spouse name: Not on file  . Number of children: Not on file  . Years of education: Not on file  . Highest education level: Not on file  Occupational History  . Not on file  Tobacco Use  . Smoking status: Never Smoker  . Smokeless tobacco: Never Used  Vaping Use  . Vaping Use: Never used  Substance and Sexual Activity  . Alcohol use: No  . Drug use: No  . Sexual activity: Yes  Other Topics Concern  . 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; [place] address. With husband at home; one daughter- 16 y.    Social Determinants of Health   Financial Resource Strain: Not on file  Food Insecurity: Not on file  Transportation Needs: Not on file  Physical Activity: Not on file  Stress: Not on file  Social Connections: Not on file  Intimate Partner Violence: Not on file    FAMILY HISTORY: Family History  Problem Relation Age of Onset  . Stroke Mother   . Heart attack Father   . Stroke Father   . Breast cancer Neg Hx     ALLERGIES:  is allergic to atorvastatin, lovastatin, pravastatin, contrast media [iodinated diagnostic agents], morphine and related, and nexium [esomeprazole magnesium].  MEDICATIONS:  Current Outpatient Medications  Medication Sig Dispense Refill  . anastrozole (ARIMIDEX) 1 MG tablet Take 1 tablet (1 mg total) by mouth daily. 90 tablet 1  . aspirin EC 81 MG tablet Take 81 mg by mouth daily.    . cholecalciferol (VITAMIN D3) 25 MCG (1000 UNIT) tablet Take 1,000 Units by mouth daily.    Marland Kitchen ezetimibe  (ZETIA) 10 MG tablet Take 10 mg by mouth every morning.     . Glycerin-Hypromellose-PEG 400 (DRY EYE RELIEF DROPS) 0.2-0.2-1 % SOLN Place 1 drop into both eyes daily as needed (Dry eye).    . hydrochlorothiazide (HYDRODIURIL) 25 MG tablet Take 25 mg by mouth every morning.     Marland Kitchen lisinopril (ZESTRIL) 30 MG tablet Take by mouth.    . triamcinolone cream (KENALOG) 0.1 % Apply topically daily.     No current facility-administered medications for this visit.      Marland Kitchen  PHYSICAL EXAMINATION: ECOG PERFORMANCE STATUS: 0 - Asymptomatic  Vitals:   05/23/21 1018  BP: (!) 161/96  Pulse: 70  Resp: 16  Temp: (!) 96.1 F (35.6 C)  SpO2: 100%   Filed Weights   05/23/21 1018  Weight: 132 lb (59.9 kg)    Physical Exam HENT:     Head: Normocephalic and atraumatic.     Mouth/Throat:     Pharynx: No oropharyngeal exudate.  Eyes:     Pupils: Pupils are equal, round, and reactive to light.  Cardiovascular:     Rate and Rhythm: Normal rate and regular rhythm.  Pulmonary:     Effort: No respiratory distress.     Breath sounds: No wheezing.  Abdominal:     General: Bowel sounds are normal. There is no distension.     Palpations: Abdomen is soft. There is no mass.     Tenderness: There is no abdominal tenderness. There is no guarding or rebound.  Musculoskeletal:        General: No tenderness. Normal range of motion.     Cervical back: Normal range of motion and neck supple.  Skin:    General: Skin is warm.  Neurological:     Mental Status: She is alert and oriented to person, place, and time.  Psychiatric:        Mood and Affect: Affect normal.      LABORATORY DATA:  I have reviewed the data as listed Lab Results  Component Value Date   WBC 4.1 02/21/2021   HGB 14.3 02/21/2021   HCT 45.0 02/21/2021   MCV 80.6 02/21/2021   PLT 263 02/21/2021   Recent Labs    10/18/20 0908 02/21/21 0941  NA  --  139  K 3.7 3.6  CL  --  101  CO2  --  28  GLUCOSE  --  100*  BUN  --  15   CREATININE  --  1.01*  CALCIUM  --  9.9  GFRNONAA  --  60*    RADIOGRAPHIC STUDIES: I have personally reviewed the radiological images as listed and agreed with the findings in the report. No results found.  ASSESSMENT & PLAN:   Carcinoma of upper-outer quadrant of right breast in female, estrogen receptor positive ([place]) # MUCINOUS RIGHT BREAST CANCER- STAGE I ER/PR-POSITIVE; HER-2 NEGATIVE. S/p Mammosite RT. NO adjuvant chemo.  ON  adjuvant anastrozole.  Stable.  #Tolerating anastrozole well.  No major side effects noted except for mild hot flashes.  Recommended total of 5 years.  # MSK- G-1 sec to AI- monitor.   # Hot flashes- G-1;STABLE; monitor for now.  # HTN- 191/96; recheck BP at home at 127/80s- STABLE  #December 2021-OSTEOPENIA- STABLE;   T-score of-1.8. [2018-hypercalemia]; continue on the- vit D1000/day; exercise; hold off bisphosphonates; recheck BMD in 2 years.    # DISPOSITION: # follow up in 1st week of dec 2022 MD;labs- cbc/cmp---Dr.B   All questions were answered. The patient/family knows to call the clinic with any problems, questions or concerns.   Cammie Sickle, MD 05/23/2021 12:49 PM

## 2021-05-23 NOTE — Assessment & Plan Note (Addendum)
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 S/p Mammosite RT. NO adjuvant chemo.  ON  adjuvant anastrozole.  Stable.  #Tolerating anastrozole well.  No major side effects noted except for mild hot flashes.  Recommended total of 5 years.  # MSK- G-1 sec to AI- monitor.   # Hot flashes- G-1;STABLE; monitor for now.  # HTN- 191/96; recheck BP at home at 127/80s- STABLE  #December 2021-OSTEOPENIA- STABLE;   T-score of-1.8. [2018-hypercalemia]; continue on the- vit D1000/day; exercise; hold off bisphosphonates; recheck BMD in 2 years.    # DISPOSITION: # follow up in 1st week of dec 2022 MD;labs- cbc/cmp---Dr.B

## 2021-06-11 ENCOUNTER — Encounter: Payer: Self-pay | Admitting: Radiation Oncology

## 2021-06-11 ENCOUNTER — Ambulatory Visit
Admission: RE | Admit: 2021-06-11 | Discharge: 2021-06-11 | Disposition: A | Discharge status: Home / Self Care | Payer: Medicare Other | Source: Ambulatory Visit | Attending: Radiation Oncology | Admitting: Radiation Oncology

## 2021-06-11 VITALS — BP 162/82 | HR 66 | Temp 98.0°F | Resp 20 | Wt 130.2 lb

## 2021-06-11 DIAGNOSIS — Z923 Personal history of irradiation: Secondary | ICD-10-CM | POA: Insufficient documentation

## 2021-06-11 DIAGNOSIS — C50411 Malignant neoplasm of upper-outer quadrant of right female breast: Secondary | ICD-10-CM | POA: Diagnosis not present

## 2021-06-11 DIAGNOSIS — Z170 Estrogen receptor positive status [ER+]: Secondary | ICD-10-CM | POA: Insufficient documentation

## 2021-06-11 DIAGNOSIS — Z79811 Long term (current) use of aromatase inhibitors: Secondary | ICD-10-CM | POA: Insufficient documentation

## 2021-06-11 NOTE — Progress Notes (Signed)
Radiation Oncology Follow up Note  Name: Laura Johns   Date:   06/11/2021 MRN:  [id number] DOB: Aug 10, 1951    This 70 y.o. female presents to the clinic today for 46-month follow-up status post accelerated partial breast radiation to her right breast for ER/PR positive invasive mucinous mammary carcinoma status post wide local excision.  REFERRING PROVIDER: [name], MD  HPI: Patient is a 70 year old female now out 6 months having completed accelerated partial breast radiation for a stage I ER/PR positive invasive mucinous mammary carcinoma status post wide local excision seen today in routine follow-up she is doing well.  She specifically denies breast tenderness cough or bone pain..  She is currently on Arimidex tolerating it well without side effect.  She has not had imaging performed yet.  COMPLICATIONS OF TREATMENT: none  FOLLOW UP COMPLIANCE: keeps appointments   PHYSICAL EXAM:  BP (!) 162/82   Pulse 66   Temp 98 F (36.7 C)   Resp 20   Wt 130 lb 3.2 oz (59.1 kg)   SpO2 100%   BMI 25.43 kg/m  Lungs are clear to A&P cardiac examination essentially unremarkable with regular rate and rhythm. No dominant mass or nodularity is noted in either breast in 2 positions examined. Incision is well-healed. No axillary or supraclavicular adenopathy is appreciated. Cosmetic result is excellent.  Well-developed well-nourished patient in NAD. HEENT reveals PERLA, EOMI, discs not visualized.  Oral cavity is clear. No oral mucosal lesions are identified. Neck is clear without evidence of cervical or supraclavicular adenopathy. Lungs are clear to A&P. Cardiac examination is essentially unremarkable with regular rate and rhythm without murmur rub or thrill. Abdomen is benign with no organomegaly or masses noted. Motor sensory and DTR levels are equal and symmetric in the upper and lower extremities. Cranial nerves II through XII are grossly intact. Proprioception is intact. No peripheral  adenopathy or edema is identified. No motor or sensory levels are noted. Crude visual fields are within normal range.  RADIOLOGY RESULTS: No current films for review  PLAN: Present time patient is doing well with no evidence of disease 6 months out.  Clinically she is doing well.  I have asked to see her back in 6 months for follow-up she will have imaging prior to that visit.  She continues on Arimidex without side effect.  Patient knows to call with any concerns.  I would like to take this opportunity to thank you for allowing me to participate in the care of your patient.Noreene Filbert, MD

## 2021-06-14 ENCOUNTER — Other Ambulatory Visit: Payer: Self-pay | Admitting: Internal Medicine

## 2021-07-10 ENCOUNTER — Other Ambulatory Visit: Payer: Self-pay | Admitting: General Surgery

## 2021-07-10 DIAGNOSIS — C50411 Malignant neoplasm of upper-outer quadrant of right female breast: Secondary | ICD-10-CM

## 2021-08-27 ENCOUNTER — Ambulatory Visit
Admission: RE | Admit: 2021-08-27 | Discharge: 2021-08-27 | Disposition: A | Discharge status: Home / Self Care | Payer: Medicare Other | Source: Ambulatory Visit | Attending: General Surgery | Admitting: General Surgery

## 2021-08-27 ENCOUNTER — Other Ambulatory Visit: Payer: Self-pay

## 2021-08-27 DIAGNOSIS — Z170 Estrogen receptor positive status [ER+]: Secondary | ICD-10-CM

## 2021-08-27 DIAGNOSIS — C50411 Malignant neoplasm of upper-outer quadrant of right female breast: Secondary | ICD-10-CM

## 2021-08-27 HISTORY — DX: Personal history of irradiation: Z92.3

## 2021-08-27 HISTORY — DX: Malignant neoplasm of unspecified site of unspecified female breast: C50.919

## 2021-10-10 ENCOUNTER — Other Ambulatory Visit (HOSPITAL_COMMUNITY): Payer: Self-pay

## 2021-10-10 MED ORDER — INFLUENZA VAC A&B SA ADJ QUAD 0.5 ML IM PRSY
PREFILLED_SYRINGE | INTRAMUSCULAR | 0 refills | Status: DC
  Filled 2021-10-10: qty 0.5, 1d supply, fill #0

## 2021-11-29 ENCOUNTER — Other Ambulatory Visit: Payer: Self-pay | Admitting: *Deleted

## 2021-11-29 DIAGNOSIS — Z170 Estrogen receptor positive status [ER+]: Secondary | ICD-10-CM

## 2021-11-29 DIAGNOSIS — C50411 Malignant neoplasm of upper-outer quadrant of right female breast: Secondary | ICD-10-CM

## 2021-12-05 ENCOUNTER — Encounter: Payer: Self-pay | Admitting: Internal Medicine

## 2021-12-05 ENCOUNTER — Inpatient Hospital Stay: Payer: Medicare Other | Attending: Internal Medicine

## 2021-12-05 ENCOUNTER — Other Ambulatory Visit: Payer: Self-pay

## 2021-12-05 ENCOUNTER — Ambulatory Visit
Admission: RE | Admit: 2021-12-05 | Discharge: 2021-12-05 | Disposition: A | Discharge status: Home / Self Care | Payer: Medicare Other | Source: Ambulatory Visit | Attending: Radiation Oncology | Admitting: Radiation Oncology

## 2021-12-05 ENCOUNTER — Inpatient Hospital Stay: Payer: Medicare Other | Admitting: Internal Medicine

## 2021-12-05 ENCOUNTER — Encounter: Payer: Self-pay | Admitting: Radiation Oncology

## 2021-12-05 VITALS — BP 164/96 | HR 77 | Resp 18 | Wt 128.7 lb

## 2021-12-05 DIAGNOSIS — Z170 Estrogen receptor positive status [ER+]: Secondary | ICD-10-CM

## 2021-12-05 DIAGNOSIS — C50411 Malignant neoplasm of upper-outer quadrant of right female breast: Secondary | ICD-10-CM | POA: Insufficient documentation

## 2021-12-05 LAB — COMPREHENSIVE METABOLIC PANEL
ALT: 15 U/L (ref 0–44)
AST: 23 U/L (ref 15–41)
Albumin: 4 g/dL (ref 3.5–5.0)
Alkaline Phosphatase: 52 U/L (ref 38–126)
Anion gap: 9 (ref 5–15)
BUN: 12 mg/dL (ref 8–23)
CO2: 29 mmol/L (ref 22–32)
Calcium: 9.9 mg/dL (ref 8.9–10.3)
Chloride: 98 mmol/L (ref 98–111)
Creatinine, Ser: 0.92 mg/dL (ref 0.44–1.00)
GFR, Estimated: >60 mL/min (ref >60)
Glucose, Bld: 101 mg/dL — ABNORMAL HIGH (ref 70–99)
Potassium: 3.6 mmol/L (ref 3.5–5.1)
Sodium: 136 mmol/L (ref 135–145)
Total Bilirubin: 0.6 mg/dL (ref 0.3–1.2)
Total Protein: 7.8 g/dL (ref 6.5–8.1)

## 2021-12-05 LAB — CBC WITH DIFFERENTIAL/PLATELET
Abs Immature Granulocytes: 0.04 10*3/uL (ref 0.00–0.07)
Basophils Absolute: 0.1 10*3/uL (ref 0.0–0.1)
Basophils Relative: 1 %
Eosinophils Absolute: 0.3 10*3/uL (ref 0.0–0.5)
Eosinophils Relative: 4 %
HCT: 41.5 % (ref 36.0–46.0)
Hemoglobin: 13.2 g/dL (ref 12.0–15.0)
Immature Granulocytes: 1 %
Lymphocytes Relative: 21 %
Lymphs Abs: 1.6 10*3/uL (ref 0.7–4.0)
MCH: 25.3 pg — ABNORMAL LOW (ref 26.0–34.0)
MCHC: 31.8 g/dL (ref 30.0–36.0)
MCV: 79.7 fL — ABNORMAL LOW (ref 80.0–100.0)
Monocytes Absolute: 0.7 10*3/uL (ref 0.1–1.0)
Monocytes Relative: 9 %
Neutro Abs: 4.9 10*3/uL (ref 1.7–7.7)
Neutrophils Relative %: 64 %
Platelets: 261 10*3/uL (ref 150–400)
RBC: 5.21 MIL/uL — ABNORMAL HIGH (ref 3.87–5.11)
RDW: 14.2 % (ref 11.5–15.5)
WBC: 7.6 10*3/uL (ref 4.0–10.5)
nRBC: 0 % (ref 0.0–0.2)

## 2021-12-05 NOTE — Progress Notes (Signed)
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 Dr.Byrnett; s/p MammoSite.  # MID JAN 2022- START ARIMIDEX  # DEC 2021- BMD- t score -1.8/osteopenia   # SURVIVORSHIP:   # GENETICS:   DIAGNOSIS: Right breast cancer  STAGE:   1      ;  GOALS: Cure  CURRENT/MOST RECENT THERAPY : Radiation; AI    Carcinoma of upper-outer quadrant of right breast in female, estrogen receptor positive ([facility]  10/06/2020 Initial Diagnosis   Carcinoma of upper-outer quadrant of right breast in female, estrogen receptor positive ([name]   01/10/2021 Cancer Staging   Staging form: Breast, AJCC 8th Edition - Clinical: Stage IA (cT1b, cN0, cM0, G1, ER+, PR+, HER2-) - Signed by BCammie Sickle MD on 01/10/2021     HISTORY OF PRESENTING ILLNESS: Alone.  Ambulating independently.  Laura Sales89y.o.  female patient with right breast cancer ER/PR positive HER-2 negative currently on adjuvant anastrozole is here for follow-up.  Patient admits to mild hot flashes.  Denies any worsening joint pains.  No nausea no vomiting.  Abdominal pain.  No new lumps or bumps.  Review of Systems  Constitutional:  Negative for chills, diaphoresis, fever, malaise/fatigue and weight loss.  HENT:  Negative for nosebleeds and sore throat.   Eyes:  Negative for double vision.  Respiratory:  Negative for cough, hemoptysis, sputum production, shortness of breath and wheezing.    Cardiovascular:  Negative for chest pain, palpitations, orthopnea and leg swelling.  Gastrointestinal:  Negative for abdominal pain, blood in stool, constipation, diarrhea, heartburn, melena, nausea and vomiting.  Genitourinary:  Negative for dysuria, frequency and urgency.  Musculoskeletal:  Positive for back pain and joint pain.  Skin: Negative.  Negative for itching and rash.  Neurological:  Negative for dizziness, tingling, focal weakness, weakness and headaches.  Endo/Heme/Allergies:  Does not bruise/bleed easily.  Psychiatric/Behavioral:  Negative for depression. The patient is not nervous/anxious and does not have insomnia.     MEDICAL HISTORY:  Past Medical History:  Diagnosis Date   Borderline diabetes mellitus    Breast cancer ([facility]    2021 right breast ca   Cancer ([facility]    Diverticulitis    Hyperlipemia    Hypertension    Personal history of radiation therapy    2021 mammosite    SURGICAL HISTORY: Past Surgical History:  Procedure Laterality Date   ABDOMINAL HYSTERECTOMY     AXILLARY SENTINEL NODE BIOPSY Right 10/20/2020   Procedure: AXILLARY SENTINEL NODE BIOPSY;  Surgeon: BRobert Bellow MD;  Location: [facility] ORS;  Service: General;  Laterality: Right;   BREAST BIOPSY Right 09/27/2020   GRADE I INVASIVE MAMMARY CARCINOMA   BREAST LUMPECTOMY WITH NEEDLE LOCALIZATION Right 10/20/2020   Procedure: BREAST LUMPECTOMY WITH NEEDLE LOCALIZATION;  Surgeon: BRobert Bellow MD;  Location: [facility] ORS;  Service: General;  Laterality: Right;   CATARACT EXTRACTION W/PHACO Right 08/25/2014   Procedure: CATARACT EXTRACTION PHACO AND INTRAOCULAR LENS PLACEMENT (IPeach;  Surgeon: KTonny Branch MD;  Location: [facility];  Service: Ophthalmology;  Laterality: Right;  CDE 5.82   CATARACT EXTRACTION W/PHACO Left 04/14/2020   Procedure: CATARACT EXTRACTION PHACO AND INTRAOCULAR LENS PLACEMENT LEFT EYE CDE=6.73;  Surgeon: Laura Goldmann, MD;  Location: [facility] ORS;  Service: Ophthalmology;   Laterality: Left;  left   COLONOSCOPY WITH PROPOFOL N/A 09/05/2017   Procedure: COLONOSCOPY WITH PROPOFOL;  Surgeon: Laura Silvas, MD;  [facility] ENDOSCOPY;  Service: Endoscopy;  Laterality: N/A;   ESOPHAGOGASTRODUODENOSCOPY (EGD) WITH PROPOFOL N/A 09/05/2017   Procedure: ESOPHAGOGASTRODUODENOSCOPY (EGD) WITH PROPOFOL;  Surgeon: Laura Silvas, MD;  Location: [facility] ENDOSCOPY;  Service: Endoscopy;  Laterality: N/A;    SOCIAL HISTORY: Social History   Socioeconomic History   Marital status: Married    Spouse name: Not on file   Number of children: Not on file   Years of education: Not on file   Highest education level: Not on file  Occupational History   Not on file  Tobacco Use   Smoking status: Never   Smokeless tobacco: Never  Vaping Use   Vaping Use: Never used  Substance and Sexual Activity   Alcohol use: No   Drug use: No   Sexual activity: Yes  Other Topics Concern   Not on file  Social History Narrative   Never smoked; no alcohol; works in Scientist, research (medical). [place]; [place] address. With husband at home; one daughter- 37 y.    Social Determinants of Health   Financial Resource Strain: Not on file  Food Insecurity: Not on file  Transportation Needs: Not on file  Physical Activity: Not on file  Stress: Not on file  Social Connections: Not on file  Intimate Partner Violence: Not on file    FAMILY HISTORY: Family History  Problem Relation Age of Onset   Stroke Mother    Heart attack Father    Stroke Father    Breast cancer Neg Hx     ALLERGIES:  is allergic to atorvastatin, lovastatin, pravastatin, contrast media [iodinated diagnostic agents], morphine and related, and nexium [esomeprazole magnesium].  MEDICATIONS:  Current Outpatient Medications  Medication Sig Dispense Refill   anastrozole (ARIMIDEX) 1 MG tablet TAKE 1 TABLET BY MOUTH  DAILY 90 tablet 3   aspirin EC 81 MG tablet Take 81 mg by mouth daily.      cholecalciferol (VITAMIN D3) 25 MCG (1000 UNIT) tablet Take 1,000 Units by mouth daily.     ezetimibe (ZETIA) 10 MG tablet Take 10 mg by mouth every morning.      Glycerin-Hypromellose-PEG 400 (DRY EYE RELIEF DROPS) 0.2-0.2-1 % SOLN Place 1 drop into both eyes daily as needed (Dry eye).     hydrochlorothiazide (HYDRODIURIL) 25 MG tablet Take 25 mg by mouth every morning.      lisinopril (ZESTRIL) 30 MG tablet Take by mouth.     triamcinolone cream (KENALOG) 0.1 % Apply topically daily.     influenza vaccine adjuvanted (FLUAD) 0.5 ML injection Inject into the muscle. (Patient not taking: Reported on 12/05/2021) 0.5 mL 0   No current facility-administered medications for this visit.      Marland Kitchen  PHYSICAL EXAMINATION: ECOG PERFORMANCE STATUS: 0 - Asymptomatic  Vitals:   12/05/21 1045  BP: (!) 163/87  Pulse: 71  Resp: 16  Temp: 97.9 F (36.6 C)   Filed Weights   12/05/21 1045  Weight: 128 lb (58.1 kg)    Physical Exam HENT:     Head: Normocephalic and atraumatic.     Mouth/Throat:     Pharynx: No  oropharyngeal exudate.  Eyes:     Pupils: Pupils are equal, round, and reactive to light.  Cardiovascular:     Rate and Rhythm: Normal rate and regular rhythm.  Pulmonary:     Effort: No respiratory distress.     Breath sounds: No wheezing.  Abdominal:     General: Bowel sounds are normal. There is no distension.     Palpations: Abdomen is soft. There is no mass.     Tenderness: There is no abdominal tenderness. There is no guarding or rebound.  Musculoskeletal:        General: No tenderness. Normal range of motion.     Cervical back: Normal range of motion and neck supple.  Skin:    General: Skin is warm.  Neurological:     Mental Status: She is alert and oriented to person, place, and time.  Psychiatric:        Mood and Affect: Affect normal.     LABORATORY DATA:  I have reviewed the data as listed Lab Results  Component Value Date   WBC 7.6 12/05/2021   HGB 13.2  12/05/2021   HCT 41.5 12/05/2021   MCV 79.7 (L) 12/05/2021   PLT 261 12/05/2021   Recent Labs    02/21/21 0941 12/05/21 0938  NA 139 136  K 3.6 3.6  CL 101 98  CO2 28 29  GLUCOSE 100* 101*  BUN 15 12  CREATININE 1.01* 0.92  CALCIUM 9.9 9.9  GFRNONAA 60* >60  PROT  --  7.8  ALBUMIN  --  4.0  AST  --  23  ALT  --  15  ALKPHOS  --  52  BILITOT  --  0.6    RADIOGRAPHIC STUDIES: I have personally reviewed the radiological images as listed and agreed with the findings in the report. No results found.  ASSESSMENT & PLAN:   Carcinoma of upper-outer quadrant of right breast in female, estrogen receptor positive ([place]) # MUCINOUS RIGHT BREAST CANCER- STAGE I ER/PR-POSITIVE; HER-2 NEGATIVE. S/p Mammosite RT. NO adjuvant chemo.  ON  adjuvant anastrozole [until Jan 2026].  STABLE; AUG 2022- mammo-WBL [Dr.Byrnett].  Stable.  #Tolerating anastrozole well.  No major side effects noted except for mild hot flashes.  Recommended total of 5 years.  # Hot flashes- G-1; Stable.monitor for now.  # HTN- 160s-; blood pressure at home 120s to 130s.  STABLE  #December 2021-OSTEOPENIA- STABLE;   T-score of-1.8. [2018-hypercalemia]; continue on the- vit D1000/day; exercise; hold off bisphosphonates; recheck BMD in 2 years.; STABLE; will order at next visit.   # DISPOSITION: # follow up in 6 months; MD;labs- cbc/cmp---Dr.B All questions were answered. The patient/family knows to call the clinic with any problems, questions or concerns.   Laura Sickle, MD 12/05/2021 11:02 AM

## 2021-12-05 NOTE — Assessment & Plan Note (Addendum)
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 S/p Mammosite RT. NO adjuvant chemo.  ON  adjuvant anastrozole [until Jan 2026].  STABLE; AUG 2022- mammo-WBL [Dr.Byrnett].  Stable.  #Tolerating anastrozole well.  No major side effects noted except for mild hot flashes.  Recommended total of 5 years.  # Hot flashes- G-1; Stable.monitor for now.  # HTN- 160s-; blood pressure at home 120s to 130s.  STABLE  #December 2021-OSTEOPENIA- STABLE;   T-score of-1.8. [2018-hypercalemia]; continue on the- vit D1000/day; exercise; hold off bisphosphonates; recheck BMD in 2 years.; STABLE; will order at next visit.   # DISPOSITION: # follow up in 6 months; MD;labs- cbc/cmp---Dr.B

## 2021-12-05 NOTE — Progress Notes (Signed)
Patient denies new problems/concerns today.   °

## 2022-05-08 ENCOUNTER — Other Ambulatory Visit: Payer: Self-pay | Admitting: Internal Medicine

## 2022-06-05 ENCOUNTER — Inpatient Hospital Stay (HOSPITAL_BASED_OUTPATIENT_CLINIC_OR_DEPARTMENT_OTHER): Payer: Medicare Other | Admitting: Internal Medicine

## 2022-06-05 ENCOUNTER — Inpatient Hospital Stay: Payer: Medicare Other | Attending: Internal Medicine

## 2022-06-05 ENCOUNTER — Encounter: Payer: Self-pay | Admitting: Internal Medicine

## 2022-06-05 DIAGNOSIS — Z79899 Other long term (current) drug therapy: Secondary | ICD-10-CM | POA: Diagnosis not present

## 2022-06-05 DIAGNOSIS — Z79811 Long term (current) use of aromatase inhibitors: Secondary | ICD-10-CM | POA: Insufficient documentation

## 2022-06-05 DIAGNOSIS — C50411 Malignant neoplasm of upper-outer quadrant of right female breast: Secondary | ICD-10-CM | POA: Insufficient documentation

## 2022-06-05 DIAGNOSIS — Z170 Estrogen receptor positive status [ER+]: Secondary | ICD-10-CM | POA: Insufficient documentation

## 2022-06-05 DIAGNOSIS — Z7982 Long term (current) use of aspirin: Secondary | ICD-10-CM | POA: Insufficient documentation

## 2022-06-05 DIAGNOSIS — M8580 Other specified disorders of bone density and structure, unspecified site: Secondary | ICD-10-CM | POA: Diagnosis not present

## 2022-06-05 LAB — COMPREHENSIVE METABOLIC PANEL
ALT: 14 U/L (ref 0–44)
AST: 21 U/L (ref 15–41)
Albumin: 4.5 g/dL (ref 3.5–5.0)
Alkaline Phosphatase: 52 U/L (ref 38–126)
Anion gap: 6 (ref 5–15)
BUN: 17 mg/dL (ref 8–23)
CO2: 29 mmol/L (ref 22–32)
Calcium: 9.7 mg/dL (ref 8.9–10.3)
Chloride: 99 mmol/L (ref 98–111)
Creatinine, Ser: 0.97 mg/dL (ref 0.44–1.00)
GFR, Estimated: >60 mL/min (ref >60)
Glucose, Bld: 92 mg/dL (ref 70–99)
Potassium: 3.7 mmol/L (ref 3.5–5.1)
Sodium: 134 mmol/L — ABNORMAL LOW (ref 135–145)
Total Bilirubin: 0.8 mg/dL (ref 0.3–1.2)
Total Protein: 7.6 g/dL (ref 6.5–8.1)

## 2022-06-05 LAB — CBC WITH DIFFERENTIAL/PLATELET
Abs Immature Granulocytes: 0.03 10*3/uL (ref 0.00–0.07)
Basophils Absolute: 0.1 10*3/uL (ref 0.0–0.1)
Basophils Relative: 1 %
Eosinophils Absolute: 0.2 10*3/uL (ref 0.0–0.5)
Eosinophils Relative: 5 %
HCT: 44 % (ref 36.0–46.0)
Hemoglobin: 14.3 g/dL (ref 12.0–15.0)
Immature Granulocytes: 1 %
Lymphocytes Relative: 39 %
Lymphs Abs: 1.6 10*3/uL (ref 0.7–4.0)
MCH: 25.9 pg — ABNORMAL LOW (ref 26.0–34.0)
MCHC: 32.5 g/dL (ref 30.0–36.0)
MCV: 79.7 fL — ABNORMAL LOW (ref 80.0–100.0)
Monocytes Absolute: 0.4 10*3/uL (ref 0.1–1.0)
Monocytes Relative: 9 %
Neutro Abs: 1.8 10*3/uL (ref 1.7–7.7)
Neutrophils Relative %: 45 %
Platelets: 260 10*3/uL (ref 150–400)
RBC: 5.52 MIL/uL — ABNORMAL HIGH (ref 3.87–5.11)
RDW: 14.6 % (ref 11.5–15.5)
WBC: 4 10*3/uL (ref 4.0–10.5)
nRBC: 0 % (ref 0.0–0.2)

## 2022-06-05 NOTE — Assessment & Plan Note (Addendum)
#[  Laura Johns ] MUCINOUS RIGHT BREAST CANCER- STAGE I ER/PR-POSITIVE; HER-2 NEGATIVE. S/p Mammosite RT. NO adjuvant chemo.  ON  adjuvant anastrozole [until Jan 2026].  STABLE; AUG 2022- mammo-WBL [Dr.Byrnett].  Stable.  #Tolerating anastrozole well.  No major side effects noted except for mild hot flashes.  Recommended total of 5 years.  # Hot flashes- G-1; Stable.monitor for now.  # HTN- 160s-; blood pressure at home 120s to 130s.  STABLE  #December 2021-OSTEOPENIA- STABLE;   T-score of-1.8. [2018-hypercalemia]; continue on the- vit D1000/day; exercise; hold off bisphosphonates; recheck BMD in 2 years.; STABLE; will order at next visit.  # Screening: Colonoscopy [last 2018; Dr.Elliot done for C.diff]; discussed continue to screen for CRC through age 55 years for average-risk patients, as long as their life expectancy is 10 years or greater. Screening at least until age 29 years for patients at average risk for CRC is recommended by most guidelines.  Wants to talk to Dr.Byrnett.    # DISPOSITION: # follow up in [place] 2nd week- 2024- ; MD;labs- cbc/cmp; BMD prior---Dr.B

## 2022-06-05 NOTE — Progress Notes (Signed)
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 Dr.Byrnett; s/p MammoSite.  # MID JAN 2022- START ARIMIDEX  # DEC 2021- BMD- t score -1.8/osteopenia   # SURVIVORSHIP:   # GENETICS:   DIAGNOSIS: Right breast cancer  STAGE:   1      ;  GOALS: Cure  CURRENT/MOST RECENT THERAPY : Radiation; AI    Carcinoma of upper-outer quadrant of right breast in female, estrogen receptor positive ([facility])  10/06/2020 Initial Diagnosis   Carcinoma of upper-outer quadrant of right breast in female, estrogen receptor positive ([facility])   01/10/2021 Cancer Staging   Staging form: Breast, AJCC 8th Edition - Clinical: Stage IA (cT1b, cN0, cM0, G1, ER+, PR+, HER2-) - Signed by Brahmanday, Govinda R, MD on 01/10/2021     HISTORY OF PRESENTING ILLNESS: Alone.  Ambulating independently.  Laura Johns 71 y.o.  female patient with right breast cancer ER/PR positive HER-2 negative currently on adjuvant anastrozole is here for follow-up.  Patient admits to mild hot flashes.  Denies any worsening joint pains.  No nausea no vomiting.  Abdominal pain.  No new lumps or bumps.  She admits to compliance with her medications.  Review of Systems  Constitutional:  Negative for chills, diaphoresis, fever, malaise/fatigue and weight loss.  HENT:  Negative for nosebleeds and sore throat.   Eyes:  Negative for double vision.  Respiratory:  Negative for cough, hemoptysis, sputum  production, shortness of breath and wheezing.   Cardiovascular:  Negative for chest pain, palpitations, orthopnea and leg swelling.  Gastrointestinal:  Negative for abdominal pain, blood in stool, constipation, diarrhea, heartburn, melena, nausea and vomiting.  Genitourinary:  Negative for dysuria, frequency and urgency.  Musculoskeletal:  Positive for back pain and joint pain.  Skin: Negative.  Negative for itching and rash.  Neurological:  Negative for dizziness, tingling, focal weakness, weakness and headaches.  Endo/Heme/Allergies:  Does not bruise/bleed easily.  Psychiatric/Behavioral:  Negative for depression. The patient is not nervous/anxious and does not have insomnia.     MEDICAL HISTORY:  Past Medical History:  Diagnosis Date   Borderline diabetes mellitus    Breast cancer ([facility])    2021 right breast ca   Cancer ([facility])    Diverticulitis    Hyperlipemia    Hypertension    Personal history of radiation therapy    2021 mammosite    SURGICAL HISTORY: Past Surgical History:  Procedure Laterality Date   ABDOMINAL HYSTERECTOMY     AXILLARY SENTINEL NODE BIOPSY Right 10/20/2020   Procedure: AXILLARY SENTINEL NODE BIOPSY;  Surgeon: Byrnett, Jeffrey W, MD;  Location: [facility] ORS;  Service: General;  Laterality: Right;   BREAST BIOPSY Right 09/27/2020   GRADE I INVASIVE MAMMARY CARCINOMA   BREAST LUMPECTOMY WITH NEEDLE LOCALIZATION Right 10/20/2020   Procedure: BREAST LUMPECTOMY WITH NEEDLE LOCALIZATION;  Surgeon: Byrnett, Jeffrey W, MD;  Location: [facility] ORS;  Service: General;  Laterality: Right;   CATARACT EXTRACTION W/PHACO Right 08/25/2014   Procedure: CATARACT EXTRACTION PHACO AND INTRAOCULAR LENS PLACEMENT (IOC);  Surgeon: Kerry   Geoffry Paradise, MD;  Location: [facility];  Service: Ophthalmology;  Laterality: Right;  CDE 5.82   CATARACT EXTRACTION W/PHACO Left 04/14/2020   Procedure: CATARACT EXTRACTION PHACO AND INTRAOCULAR LENS PLACEMENT LEFT EYE CDE=6.73;  Surgeon: Baruch Goldmann, MD;   Location: [facility];  Service: Ophthalmology;  Laterality: Left;  left   COLONOSCOPY WITH PROPOFOL N/A 09/05/2017   Procedure: COLONOSCOPY WITH PROPOFOL;  Surgeon: Manya Silvas, MD;  Location: [facility] ENDOSCOPY;  Service: Endoscopy;  Laterality: N/A;   ESOPHAGOGASTRODUODENOSCOPY (EGD) WITH PROPOFOL N/A 09/05/2017   Procedure: ESOPHAGOGASTRODUODENOSCOPY (EGD) WITH PROPOFOL;  Surgeon: Manya Silvas, MD;  Location: [facility] ENDOSCOPY;  Service: Endoscopy;  Laterality: N/A;    SOCIAL HISTORY: Social History   Socioeconomic History   Marital status: Married    Spouse name: Not on file   Number of children: Not on file   Years of education: Not on file   Highest education level: Not on file  Occupational History   Not on file  Tobacco Use   Smoking status: Never   Smokeless tobacco: Never  Vaping Use   Vaping Use: Never used  Substance and Sexual Activity   Alcohol use: No   Drug use: No   Sexual activity: Yes  Other Topics Concern   Not on file  Social History Narrative   Never smoked; no alcohol; works in Scientist, 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 With husband at home; one daughter- 40 y.    Social Determinants of Health   Financial Resource Strain: Not on file  Food Insecurity: Not on file  Transportation Needs: Not on file  Physical Activity: Not on file  Stress: Not on file  Social Connections: Not on file  Intimate Partner Violence: Not on file    FAMILY HISTORY: Family History  Problem Relation Age of Onset   Stroke Mother    Heart attack Father    Stroke Father    Breast cancer Neg Hx     ALLERGIES:  is allergic to atorvastatin, lovastatin, pravastatin, contrast media [iodinated contrast media], morphine and related, and nexium [esomeprazole magnesium].  MEDICATIONS:  Current Outpatient Medications  Medication Sig Dispense Refill   anastrozole (ARIMIDEX) 1 MG tablet TAKE 1 TABLET BY MOUTH  DAILY 90 tablet 3   aspirin EC 81 MG  tablet Take 81 mg by mouth daily.     cholecalciferol (VITAMIN D3) 25 MCG (1000 UNIT) tablet Take 1,000 Units by mouth daily.     ezetimibe (ZETIA) 10 MG tablet Take 10 mg by mouth every morning.      hydrochlorothiazide (HYDRODIURIL) 25 MG tablet Take 25 mg by mouth every morning.      lisinopril (ZESTRIL) 30 MG tablet Take by mouth.     No current facility-administered medications for this visit.      Marland Kitchen  PHYSICAL EXAMINATION: ECOG PERFORMANCE STATUS: 0 - Asymptomatic  Vitals:   06/05/22 1010  BP: (!) 166/97  Pulse: (!) 56  Temp: (!) 96.5 F (35.8 C)  SpO2: 100%   Filed Weights   06/05/22 1010  Weight: 126 lb 6.4 oz (57.3 kg)    Physical Exam HENT:     Head: Normocephalic and atraumatic.     Mouth/Throat:     Pharynx: No oropharyngeal exudate.  Eyes:     Pupils: Pupils are equal, round, and reactive to light.  Cardiovascular:     Rate and Rhythm: Normal rate and regular rhythm.  Pulmonary:     Effort: No respiratory distress.  Breath sounds: No wheezing.  Abdominal:     General: Bowel sounds are normal. There is no distension.     Palpations: Abdomen is soft. There is no mass.     Tenderness: There is no abdominal tenderness. There is no guarding or rebound.  Musculoskeletal:        General: No tenderness. Normal range of motion.     Cervical back: Normal range of motion and neck supple.  Skin:    General: Skin is warm.  Neurological:     Mental Status: She is alert and oriented to person, place, and time.  Psychiatric:        Mood and Affect: Affect normal.     LABORATORY DATA:  I have reviewed the data as listed Lab Results  Component Value Date   WBC 4.0 06/05/2022   HGB 14.3 06/05/2022   HCT 44.0 06/05/2022   MCV 79.7 (L) 06/05/2022   PLT 260 06/05/2022   Recent Labs    12/05/21 0938 06/05/22 0938  NA 136 134*  K 3.6 3.7  CL 98 99  CO2 29 29  GLUCOSE 101* 92  BUN 12 17  CREATININE 0.92 0.97  CALCIUM 9.9 9.7  GFRNONAA >60 >60  PROT  7.8 7.6  ALBUMIN 4.0 4.5  AST 23 21  ALT 15 14  ALKPHOS 52 52  BILITOT 0.6 0.8    RADIOGRAPHIC STUDIES: I have personally reviewed the radiological images as listed and agreed with the findings in the report. No results found.  ASSESSMENT & PLAN:   Carcinoma of upper-outer quadrant of right breast in female, estrogen receptor positive ([place]) [place] ] MUCINOUS RIGHT BREAST CANCER- STAGE I ER/PR-POSITIVE; HER-2 NEGATIVE. S/p Mammosite RT. NO adjuvant chemo.  ON  adjuvant anastrozole [until Jan 2026].  STABLE; AUG 2022- mammo-[facility]BL [Dr.Byrnett].  Stable.  #Tolerating anastrozole well.  No major side effects noted except for mild hot flashes.  Recommended total of 5 years.  # Hot flashes- G-1; Stable.monitor for now.  # HTN- 160s-; blood pressure at home 120s to 130s.  STABLE  #December 2021-OSTEOPENIA- STABLE;   T-score of-1.8. [2018-hypercalemia]; continue on the- vit D1000/day; exercise; hold off bisphosphonates; recheck BMD in 2 years.; STABLE; will order at next visit.  # Screening: Colonoscopy [last 2018; Dr.Elliot done for C.diff]; discussed continue to screen for CRC through age 70 years for average-risk patients, as long as their life expectancy is 10 years or greater. Screening at least until age 60 years for patients at average risk for CRC is recommended by most guidelines.  Wants to talk to Dr.Byrnett.    # DISPOSITION: # follow up in [place] 2nd week- 2024- ; MD;labs- cbc/cmp; BMD prior---Dr.B  All questions were answered. The patient/family knows to call the clinic with any problems, questions or concerns.   Cammie Sickle, MD 06/05/2022 10:57 AM

## 2022-07-22 ENCOUNTER — Other Ambulatory Visit: Payer: Self-pay | Admitting: Family Medicine

## 2022-07-22 ENCOUNTER — Other Ambulatory Visit: Payer: Self-pay | Admitting: General Surgery

## 2022-07-22 DIAGNOSIS — Z853 Personal history of malignant neoplasm of breast: Secondary | ICD-10-CM

## 2022-07-22 DIAGNOSIS — Z1231 Encounter for screening mammogram for malignant neoplasm of breast: Secondary | ICD-10-CM

## 2022-07-26 ENCOUNTER — Other Ambulatory Visit: Payer: Self-pay | Admitting: General Surgery

## 2022-07-26 DIAGNOSIS — Z170 Estrogen receptor positive status [ER+]: Secondary | ICD-10-CM

## 2022-08-24 IMAGING — US US BREAST*R* LIMITED INC AXILLA
1 series · 8 of 8 positions shown · non-contrast
Comparison: Previous exams.

CLINICAL DATA: Screening recall for possible right breast
asymmetry.

EXAM:
DIGITAL DIAGNOSTIC UNILATERAL RIGHT MAMMOGRAM WITH TOMO AND CAD;
ULTRASOUND RIGHT BREAST LIMITED

[Series 1: us breast*right* limited inc axilla · 0.06mm/px · 8 of 8 slices shown]
[im 1/8]
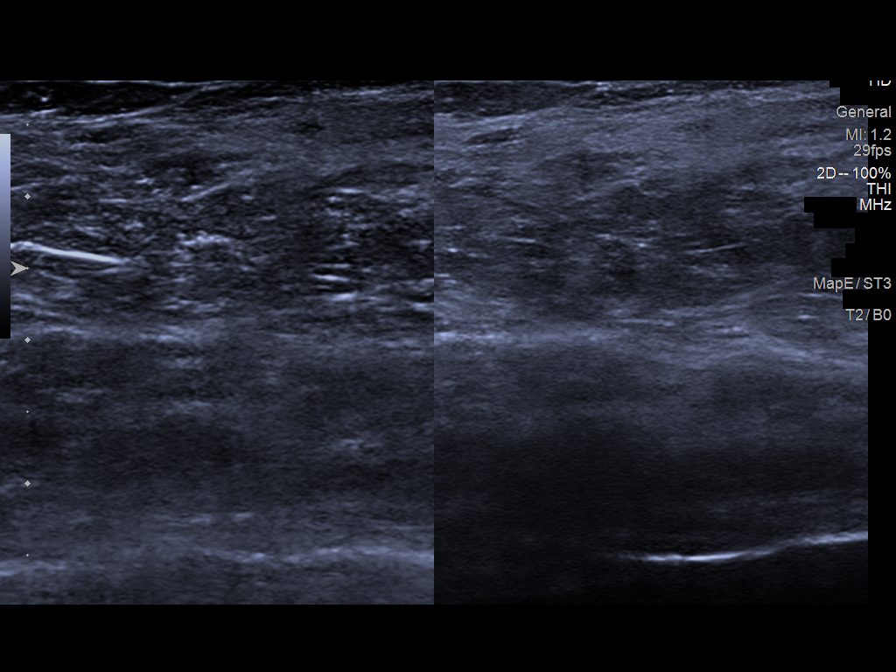
[im 2/8]
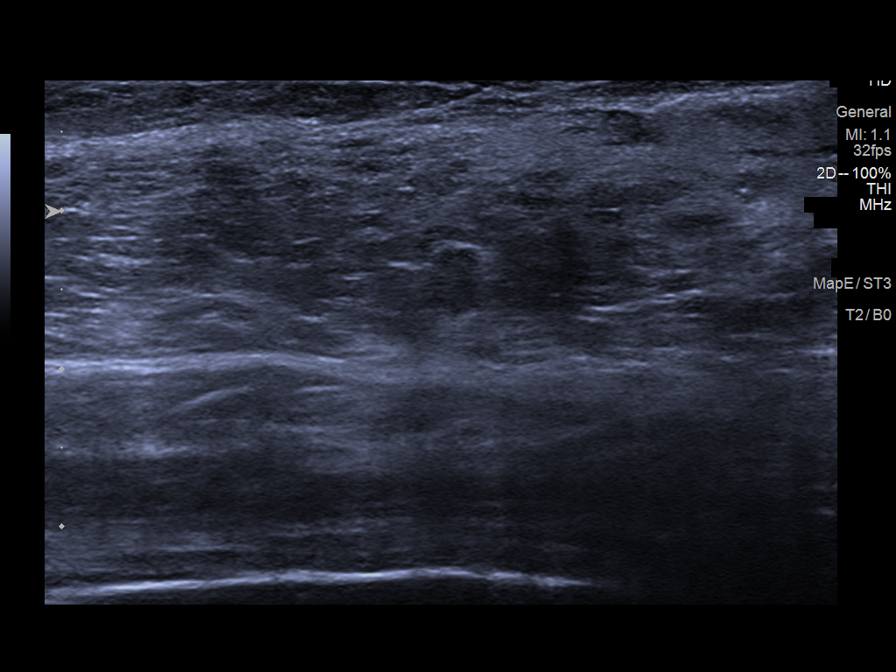
[im 3/8]
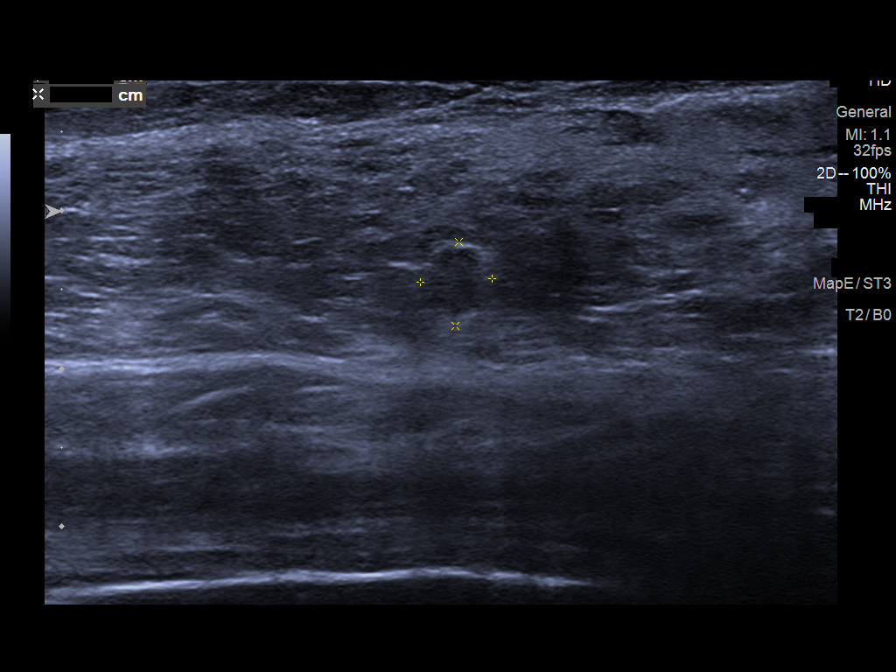
[im 4/8]
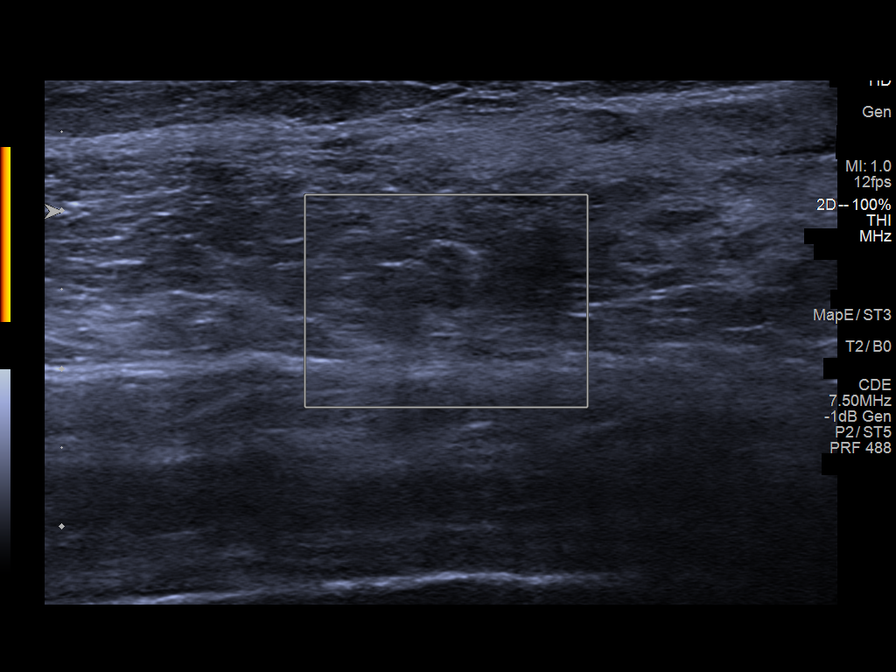
[im 5/8]
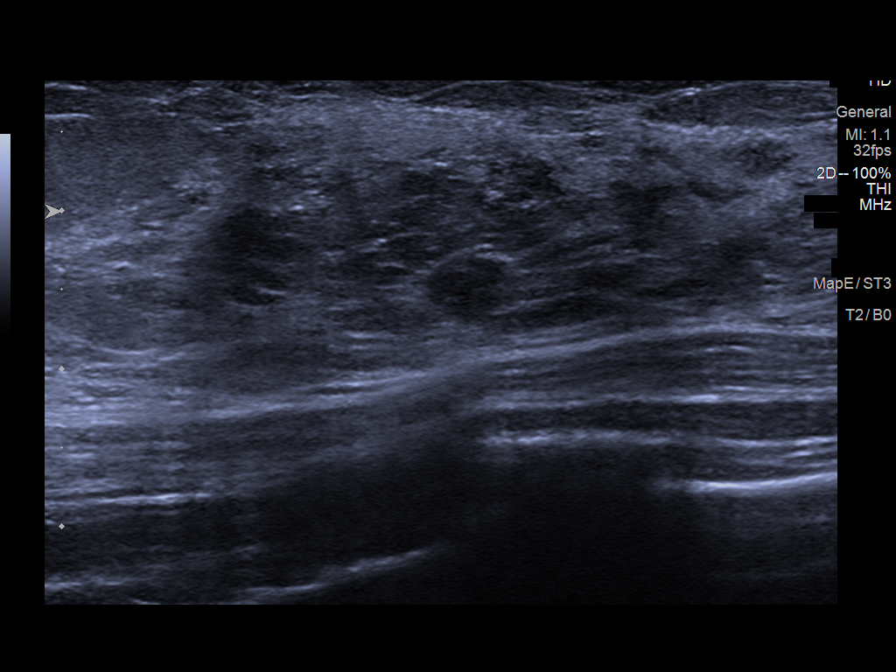
[im 6/8]
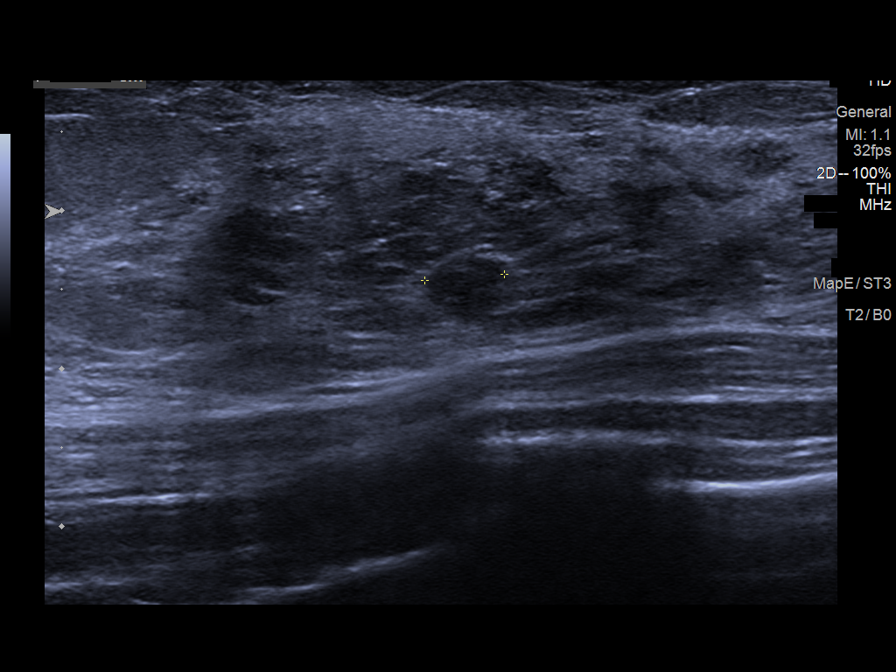
[im 7/8]
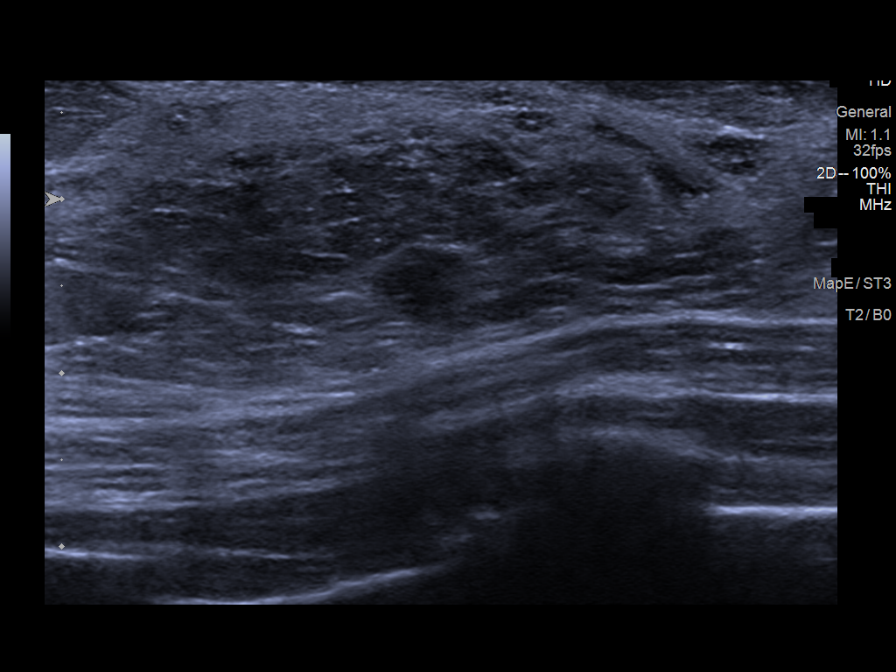
[im 8/8]
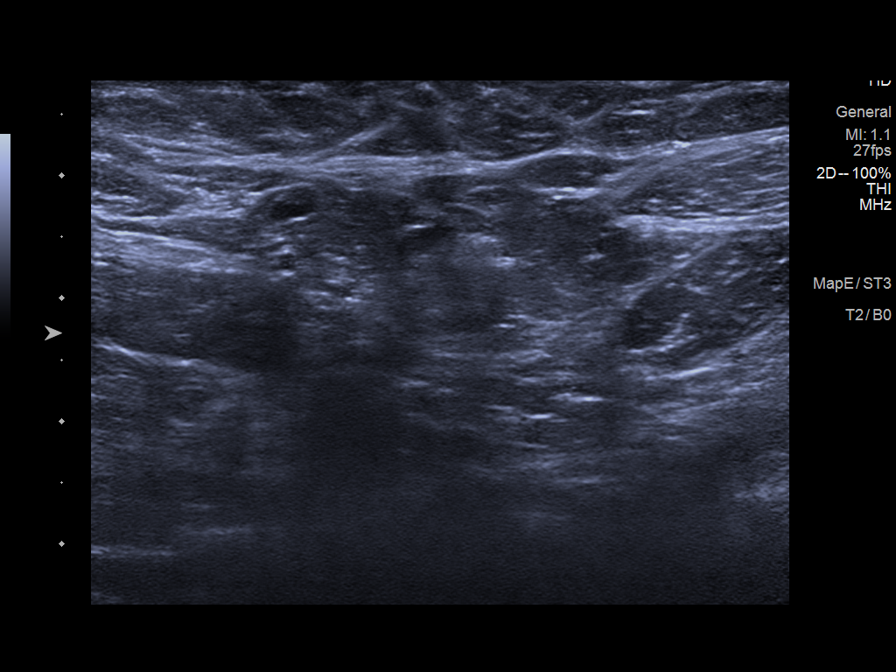

[8 of 8 positions shown; findings below may reference images not displayed]

ACR Breast Density Category b: There are scattered areas of
fibroglandular density.
FINDINGS: The initially questioned possible right breast asymmetry persists,
although is not well seen on the spot compression MLO tomograms.

Mammographic images were processed with CAD.

Targeted ultrasound of the outer right breast was performed. There
is an irregular mildly hypoechoic mass in the right breast at 9
o'clock 2 cm from nipple measuring 0.5 x 0.3 x 0.5 cm. This is felt
to correspond well with the asymmetry seen in the right breast at
mammography. No lymphadenopathy seen in the right axilla.
IMPRESSION: Suspicious 0.5 cm right breast mass.

RECOMMENDATION:
Recommend ultrasound-guided biopsy of the mass in the right breast
at the 9 o'clock position.

I have discussed the findings and recommendations with the patient.
If applicable, a reminder letter will be sent to the patient
regarding the next appointment.

BI-RADS CATEGORY  4: Suspicious.

## 2022-08-29 ENCOUNTER — Ambulatory Visit
Admission: RE | Admit: 2022-08-29 | Discharge: 2022-08-29 | Disposition: A | Discharge status: Home / Self Care | Payer: Medicare Other | Source: Ambulatory Visit | Attending: Internal Medicine | Admitting: Internal Medicine

## 2022-08-29 ENCOUNTER — Ambulatory Visit
Admission: RE | Admit: 2022-08-29 | Discharge: 2022-08-29 | Disposition: A | Discharge status: Home / Self Care | Payer: Medicare Other | Source: Ambulatory Visit | Attending: General Surgery | Admitting: General Surgery

## 2022-08-29 DIAGNOSIS — Z780 Asymptomatic menopausal state: Secondary | ICD-10-CM | POA: Diagnosis not present

## 2022-08-29 DIAGNOSIS — Z13820 Encounter for screening for osteoporosis: Secondary | ICD-10-CM | POA: Insufficient documentation

## 2022-08-29 DIAGNOSIS — Z1231 Encounter for screening mammogram for malignant neoplasm of breast: Secondary | ICD-10-CM | POA: Insufficient documentation

## 2022-08-29 DIAGNOSIS — Z853 Personal history of malignant neoplasm of breast: Secondary | ICD-10-CM

## 2022-08-29 DIAGNOSIS — M8589 Other specified disorders of bone density and structure, multiple sites: Secondary | ICD-10-CM | POA: Diagnosis not present

## 2022-08-29 DIAGNOSIS — Z170 Estrogen receptor positive status [ER+]: Secondary | ICD-10-CM | POA: Insufficient documentation

## 2022-08-29 DIAGNOSIS — C50411 Malignant neoplasm of upper-outer quadrant of right female breast: Secondary | ICD-10-CM | POA: Insufficient documentation

## 2022-09-09 ENCOUNTER — Other Ambulatory Visit: Payer: Self-pay | Admitting: General Surgery

## 2022-09-09 NOTE — Progress Notes (Signed)
Progress Notes - documented in this encounter Tiandre Teall, Geronimo Boot, MD - 09/05/2022 11:15 AM EDT Formatting of this note is different from the original. Images from the original note were not included. Subjective:   Patient ID: Laura Johns is a 71 y.o. female.  HPI  The following portions of the patient's history were reviewed and updated as appropriate.  This an established patient is here today for: office visit. The patient is here today to follow up from a recent mammogram. The patient had a right lumpectomy on 10-20-20. She reports she is doing well on Arimidex.   Chief Complaint  Patient presents with  Follow-up    BP (!) 148/72  Pulse 75  Temp 36.5 C (97.7 F)  Ht 154.9 cm ('5\' 1"' )  Wt 58.1 kg (128 lb)  LMP 12/30/1996 (Exact Date)  SpO2 99%  BMI 24.19 kg/m   Past Medical History:  Diagnosis Date  Clostridium difficile infection 08/01/2017  Treated with Flagyl  Diverticulitis  Essential hypertension, benign 06/30/2014  History of radiation therapy  Malignant neoplasm of lower-outer quadrant of right breast of female, estrogen receptor positive (CMS-HCC) 10/20/2020  T1 a, N0; ER positive, PR positive, HER-2/neu not overexpressed. Minimal margin for invasive cancer 2 mm, DCIS 5 mm.  Other and unspecified angina pectoris (CMS-HCC) 06/30/2014  Other and unspecified hyperlipidemia 06/30/2014    Past Surgical History:  Procedure Laterality Date  HYSTERECTOMY 1998  cervix still present  CATARACT EXTRACTION Right 08/25/2014  COLONOSCOPY 09/05/2017  Int Hemorrhoids, Diveticulosis: No repeat per RTE  EGD 09/05/2017  CATARACT EXTRACTION Left 03/2020  cataract surgery Left 04/14/2020  INCISIONAL BIOPSY BREAST Right 09/27/2020  MASTECTOMY PARTIAL / LUMPECTOMY Right 10/20/2020  right lumpectomy  COLONOSCOPY 06/19/2007, 07/14/1996  Normal Colon: CBF 05/2017    OB History   Gravida  2  Para  1  Term  1  Preterm   AB  1  Living  1    SAB  1  IAB    Ectopic   Molar   Multiple   Live Births     Obstetric Comments  Age at first period 32 Age of first pregnancy 6      Social History   Socioeconomic History  Marital status: Married  Occupational History  Occupation: Industrial/product designer  Comment: Shipping Department  Tobacco Use  Smoking status: Never  Passive exposure: Past  Smokeless tobacco: Never  Vaping Use  Vaping Use: Never used  Substance and Sexual Activity  Alcohol use: No  Drug use: No  Sexual activity: Yes  Partners: Male  Birth control/protection: Surgical  Comment: Hysterectomy  Social History Narrative  Marital status- Married  Lives with husband  Employment- Herbalist (shipping)  Exercise hx- Walks 30 minutes daily  Religious affliation- [employer]    Allergies  Allergen Reactions  Iodinated Contrast Media Rash  Lipitor [Atorvastatin] Muscle Pain  Lovastatin Muscle Pain  Myalgias and muscle cramps in legs  Morphine Anxiety  Nexium [Esomeprazole Magnesium] Rash  Pravastatin Muscle Pain   Current Outpatient Medications  Medication Sig Dispense Refill  anastrozole (ARIMIDEX) 1 mg tablet Take 1 mg by mouth once daily  aspirin 81 MG EC tablet Take 81 mg by mouth once daily.  benzonatate (TESSALON) 200 MG capsule Take 1 capsule (200 mg total) by mouth 3 (three) times daily as needed 30 capsule 0  cholecalciferol (VITAMIN D3) 1,000 unit tablet Take 1,000 Units by mouth once daily  ezetimibe (ZETIA) 10 mg tablet TAKE 1 TABLET BY MOUTH ONCE DAILY  90 tablet 3  hydroCHLOROthiazide (HYDRODIURIL) 25 MG tablet TAKE 1 TABLET BY MOUTH ONCE DAILY 90 tablet 3  lisinopriL (ZESTRIL) 30 MG tablet Take 1 tablet (30 mg total) by mouth once daily 90 tablet 1  miconazole/cleanser 17 on wipe (MONISTAT 7 CREAM, APPL, WIPES VAGL) Place vaginally Prn  triamcinolone 0.1 % cream APPLY TO AFFECTED AREA TOPICALLY ONCE DAILY 90 g 0   No current facility-administered medications for this visit.    Family History  Problem Relation Age of Onset  Stroke Mother  Stroke Father  High blood pressure (Hypertension) Father  High blood pressure (Hypertension) Sister  High blood pressure (Hypertension) Brother  High blood pressure (Hypertension) Brother  Colon cancer Maternal Grandmother  Breast cancer Neg Hx   Labs and Radiology:   August the D1, 2023 bilateral mammogram:  Focal rim calcification and a 2 x 3 cm area consistent with fat necrosis post MammoSite radiation. Stable.  Colonoscopy September 05, 2017:  Exam completed for diarrhea with identification of pseudomembranous colitis. No polyps seen. No biopsies. Normal colonoscopy reported 2008.  Review of Systems  Constitutional: Negative for chills and fever.  Respiratory: Negative for cough.    Objective:  Physical Exam Exam conducted with a chaperone present.  Constitutional:  Appearance: Normal appearance.  Cardiovascular:  Rate and Rhythm: Normal rate and regular rhythm.  Pulses: Normal pulses.  Heart sounds: Normal heart sounds.  Pulmonary:  Effort: Pulmonary effort is normal.  Breath sounds: Normal breath sounds.  Chest:  Breasts: Right: Mass (focal thickening post Mammosite radiation.) present.  Left: Normal.   Musculoskeletal:  Cervical back: Neck supple.  Lymphadenopathy:  Upper Body:  Right upper body: No supraclavicular or axillary adenopathy.  Left upper body: No supraclavicular or axillary adenopathy.  Skin: General: Skin is warm and dry.  Neurological:  Mental Status: She is alert and oriented to person, place, and time.  Psychiatric:  Mood and Affect: Mood normal.  Behavior: Behavior normal.    Assessment:   Doing well post right breast conservation. Asymptomatic fat necrosis without visual distortion of the breast.  Colonoscopy in 2018 possibly compromised by evidence of pseudomembranous colitis.  Plan:   The case was reviewed informally with GI. The exam in 2018 may not of been  adequate to evaluate for polyps and a repeat exam is appropriate.  The patient is amenable to colonoscopy. Pros and cons reviewed. Risks associated with procedure discussed. Instructed in preparation by the staff.  She will follow-up with Nanci Pina, MD in 1 year with bilateral screening mammograms in regards to her breast cancer.   This note is partially prepared by Ledell Noss, CMA acting as a scribe in the presence of Dr. Hervey Ard, MD.   The documentation recorded by the scribe accurately reflects the service I personally performed and the decisions made by me.   Robert Bellow, MD FACS  Electronically signed by Mayer Masker, MD at 09/09/2022 3:13 PM EDT

## 2022-10-09 ENCOUNTER — Encounter: Payer: Self-pay | Admitting: General Surgery

## 2022-10-09 ENCOUNTER — Encounter
Admission: RE | Disposition: A | Discharge status: Home / Self Care | Payer: Self-pay | Source: Home / Self Care | Attending: General Surgery

## 2022-10-09 ENCOUNTER — Ambulatory Visit: Payer: Medicare Other | Admitting: Anesthesiology

## 2022-10-09 ENCOUNTER — Ambulatory Visit
Admission: RE | Admit: 2022-10-09 | Discharge: 2022-10-09 | Disposition: A | Discharge status: Home / Self Care | Payer: Medicare Other | Attending: General Surgery | Admitting: General Surgery

## 2022-10-09 ENCOUNTER — Other Ambulatory Visit: Payer: Self-pay

## 2022-10-09 DIAGNOSIS — Z79811 Long term (current) use of aromatase inhibitors: Secondary | ICD-10-CM | POA: Diagnosis not present

## 2022-10-09 DIAGNOSIS — I10 Essential (primary) hypertension: Secondary | ICD-10-CM | POA: Diagnosis not present

## 2022-10-09 DIAGNOSIS — Z1211 Encounter for screening for malignant neoplasm of colon: Secondary | ICD-10-CM | POA: Diagnosis not present

## 2022-10-09 DIAGNOSIS — Z853 Personal history of malignant neoplasm of breast: Secondary | ICD-10-CM | POA: Insufficient documentation

## 2022-10-09 DIAGNOSIS — K5730 Diverticulosis of large intestine without perforation or abscess without bleeding: Secondary | ICD-10-CM | POA: Insufficient documentation

## 2022-10-09 DIAGNOSIS — E785 Hyperlipidemia, unspecified: Secondary | ICD-10-CM | POA: Diagnosis not present

## 2022-10-09 DIAGNOSIS — Z79899 Other long term (current) drug therapy: Secondary | ICD-10-CM | POA: Insufficient documentation

## 2022-10-09 DIAGNOSIS — E119 Type 2 diabetes mellitus without complications: Secondary | ICD-10-CM | POA: Insufficient documentation

## 2022-10-09 HISTORY — PX: COLONOSCOPY WITH PROPOFOL: SHX5780

## 2022-10-09 SURGERY — COLONOSCOPY WITH PROPOFOL
Anesthesia: General

## 2022-10-09 MED ORDER — PROPOFOL 500 MG/50ML IV EMUL
INTRAVENOUS | Status: DC | PRN
  Administered 2022-10-09: 120 ug/kg/min via INTRAVENOUS

## 2022-10-09 MED ORDER — GLYCOPYRROLATE 0.2 MG/ML IJ SOLN
INTRAMUSCULAR | Status: DC | PRN
  Administered 2022-10-09: .2 mg via INTRAVENOUS

## 2022-10-09 MED ORDER — SODIUM CHLORIDE 0.9 % IV SOLN: INTRAVENOUS | Status: DC

## 2022-10-09 MED ORDER — LIDOCAINE HCL (CARDIAC) PF 100 MG/5ML IV SOSY
PREFILLED_SYRINGE | INTRAVENOUS | Status: DC | PRN
  Administered 2022-10-09: 50 mg via INTRAVENOUS

## 2022-10-09 MED ORDER — PROPOFOL 10 MG/ML IV BOLUS
INTRAVENOUS | Status: DC | PRN
  Administered 2022-10-09: 80 mg via INTRAVENOUS

## 2022-10-09 NOTE — Anesthesia Postprocedure Evaluation (Signed)
Anesthesia Post Note  Patient: Laura Johns  Procedure(s) Performed: COLONOSCOPY WITH PROPOFOL  Patient location during evaluation: Endoscopy Anesthesia Type: General Level of consciousness: awake and alert Pain management: pain level controlled Vital Signs Assessment: post-procedure vital signs reviewed and stable Respiratory status: spontaneous breathing, nonlabored ventilation, respiratory function stable and patient connected to nasal cannula oxygen Cardiovascular status: blood pressure returned to baseline and stable Postop Assessment: no apparent nausea or vomiting Anesthetic complications: no   No notable events documented.   Last Vitals:  Vitals:   10/09/22 0839 10/09/22 0849  BP: 138/84 (!) 145/90  Pulse: 61 62  Resp: 20 20  Temp:    SpO2: 100% 100%    Last Pain:  Vitals:   10/09/22 0849  TempSrc:   PainSc: 0-No pain                 Precious Haws Cleland Simkins

## 2022-10-09 NOTE — Transfer of Care (Signed)
Immediate Anesthesia Transfer of Care Note  Patient: Laura Johns  Procedure(s) Performed: COLONOSCOPY WITH PROPOFOL  Patient Location: PACU and Endoscopy Unit  Anesthesia Type:General  Level of Consciousness: drowsy and patient cooperative  Airway & Oxygen Therapy: Patient Spontanous Breathing  Post-op Assessment: Report given to RN and Post -op Vital signs reviewed and stable  Post vital signs: Reviewed and stable  Last Vitals:  Vitals Value Taken Time  BP 119/74 10/09/22 0829  Temp    Pulse 61 10/09/22 0829  Resp 22 10/09/22 0829  SpO2 100 % 10/09/22 0829    Last Pain:  Vitals:   10/09/22 0829  TempSrc:   PainSc: Asleep         Complications: No notable events documented.

## 2022-10-09 NOTE — H&P (Signed)
Laura Johns [id number] Apr 10, 1951     HPI:  Healthy 71 y/o for screeniong colonoscopy.   Medications Prior to Admission  Medication Sig Dispense Refill Last Dose   anastrozole (ARIMIDEX) 1 MG tablet TAKE 1 TABLET BY MOUTH  DAILY 90 tablet 3 10/08/2022   cholecalciferol (VITAMIN D3) 25 MCG (1000 UNIT) tablet Take 1,000 Units by mouth daily.   10/08/2022   ezetimibe (ZETIA) 10 MG tablet Take 10 mg by mouth every morning.    10/08/2022   hydrochlorothiazide (HYDRODIURIL) 25 MG tablet Take 25 mg by mouth every morning.    10/08/2022   lisinopril (ZESTRIL) 30 MG tablet Take by mouth.   10/08/2022   aspirin EC 81 MG tablet Take 81 mg by mouth daily.   10/06/2022   Allergies  Allergen Reactions   Atorvastatin Other (See Comments)    Legs hurt   Lovastatin Other (See Comments)    Myalgias and muscle cramps in legs   Pravastatin Other (See Comments)    Legs hurt   Contrast Media [Iodinated Contrast Media] Rash   Morphine And Related Anxiety   Nexium [Esomeprazole Magnesium] Rash   Past Medical History:  Diagnosis Date   Borderline diabetes mellitus    Breast cancer ([place])    2021 right breast ca   Cancer ([place])    Diverticulitis    Hyperlipemia    Hypertension    Personal history of radiation therapy    2021 mammosite   Past Surgical History:  Procedure Laterality Date   ABDOMINAL HYSTERECTOMY     AXILLARY SENTINEL NODE BIOPSY Right 10/20/2020   Procedure: AXILLARY SENTINEL NODE BIOPSY;  Surgeon: Robert Bellow, MD;  Location: [facility] ORS;  Service: General;  Laterality: Right;   BREAST BIOPSY Right 09/27/2020   GRADE I INVASIVE MAMMARY CARCINOMA   BREAST LUMPECTOMY WITH NEEDLE LOCALIZATION Right 10/20/2020   Procedure: BREAST LUMPECTOMY WITH NEEDLE LOCALIZATION;  Surgeon: Robert Bellow, MD;  Location: [facility] ORS;  Service: General;  Laterality: Right;   CATARACT EXTRACTION W/PHACO Right 08/25/2014   Procedure: CATARACT EXTRACTION PHACO AND INTRAOCULAR LENS PLACEMENT ([place]);   Surgeon: Tonny Branch, MD;  Location: [facility];  Service: Ophthalmology;  Laterality: Right;  CDE 5.82   CATARACT EXTRACTION W/PHACO Left 04/14/2020   Procedure: CATARACT EXTRACTION PHACO AND INTRAOCULAR LENS PLACEMENT LEFT EYE CDE=6.73;  Surgeon: Baruch Goldmann, MD;  Location: [facility] ORS;  Service: Ophthalmology;  Laterality: Left;  left   COLONOSCOPY WITH PROPOFOL N/A 09/05/2017   Procedure: COLONOSCOPY WITH PROPOFOL;  Surgeon: Manya Silvas, MD;  Location: [facility] ENDOSCOPY;  Service: Endoscopy;  Laterality: N/A;   ESOPHAGOGASTRODUODENOSCOPY (EGD) WITH PROPOFOL N/A 09/05/2017   Procedure: ESOPHAGOGASTRODUODENOSCOPY (EGD) WITH PROPOFOL;  Surgeon: Manya Silvas, MD;  Location: [facility] ENDOSCOPY;  Service: Endoscopy;  Laterality: N/A;   Social History   Socioeconomic History   Marital status: Married    Spouse name: Not on file   Number of children: Not on file   Years of education: Not on file   Highest education level: Not on file  Occupational History   Not on file  Tobacco Use   Smoking status: Never   Smokeless tobacco: Never  Vaping Use   Vaping Use: Never used  Substance and Sexual Activity   Alcohol use: No   Drug use: No   Sexual activity: Yes  Other Topics Concern   Not on file  Social History Narrative   Never smoked; no alcohol; works in Scientist, 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 With husband at home; one daughter- 80 y.    Social Determinants of Health   Financial Resource Strain: Not on file  Food Insecurity: Not on file  Transportation Needs: Not on file  Physical Activity: Not on file  Stress: Not on file  Social Connections: Not on file  Intimate Partner Violence: Not on file   Social History   Social History Narrative   Never smoked; no alcohol; works in Scientist, research (medical). Lives in [place]; [place] address. With husband at home; one daughter- 25 y.      ROS: Negative.     PE: HEENT: Negative. Lungs:  Clear. Cardio: RR.   Assessment/Plan:  Proceed with planned endoscopy.  Forest Gleason Kalifa Cadden 10/09/2022

## 2022-10-09 NOTE — Anesthesia Preprocedure Evaluation (Signed)
Anesthesia Evaluation  Patient identified by MRN, date of birth, ID band Patient awake    Reviewed: Allergy & Precautions, NPO status , Patient's Chart, lab work & pertinent test results  History of Anesthesia Complications Negative for: history of anesthetic complications  Airway Mallampati: III  TM Distance: >3 FB Neck ROM: full    Dental  (+) Chipped   Pulmonary neg pulmonary ROS, neg shortness of breath,    Pulmonary exam normal        Cardiovascular Exercise Tolerance: Good hypertension, (-) anginaNormal cardiovascular exam     Neuro/Psych negative neurological ROS  negative psych ROS   GI/Hepatic negative GI ROS, Neg liver ROS, neg GERD  ,  Endo/Other  negative endocrine ROS  Renal/GU negative Renal ROS  negative genitourinary   Musculoskeletal   Abdominal   Peds  Hematology negative hematology ROS (+)   Anesthesia Other Findings Past Medical History: No date: Borderline diabetes mellitus No date: Breast cancer ([place])     Comment:  2021 right breast ca No date: Cancer ([place]) No date: Diverticulitis No date: Hyperlipemia No date: Hypertension No date: Personal history of radiation therapy     Comment:  2021 mammosite  Past Surgical History: No date: ABDOMINAL HYSTERECTOMY 10/20/2020: AXILLARY SENTINEL NODE BIOPSY; Right     Comment:  Procedure: AXILLARY SENTINEL NODE BIOPSY;  Surgeon:               Robert Bellow, MD;  Location: [facility] ORS;  Service:               General;  Laterality: Right; 09/27/2020: BREAST BIOPSY; Right     Comment:  GRADE I INVASIVE MAMMARY CARCINOMA 10/20/2020: BREAST LUMPECTOMY WITH NEEDLE LOCALIZATION; Right     Comment:  Procedure: BREAST LUMPECTOMY WITH NEEDLE LOCALIZATION;                Surgeon: Robert Bellow, MD;  Location: [facility] ORS;                Service: General;  Laterality: Right; 08/25/2014: CATARACT EXTRACTION W/PHACO; Right     Comment:  Procedure:  CATARACT EXTRACTION PHACO AND INTRAOCULAR               LENS PLACEMENT ([place]);  Surgeon: Tonny Branch, MD;                Location: [facility];  Service: Ophthalmology;  Laterality:               Right;  CDE 5.82 04/14/2020: CATARACT EXTRACTION W/PHACO; Left     Comment:  Procedure: CATARACT EXTRACTION PHACO AND INTRAOCULAR               LENS PLACEMENT LEFT EYE CDE=6.73;  Surgeon: Baruch Goldmann, MD;  Location: [facility];  Service: Ophthalmology;                Laterality: Left;  left 09/05/2017: COLONOSCOPY WITH PROPOFOL; N/A     Comment:  Procedure: COLONOSCOPY WITH PROPOFOL;  Surgeon: Manya Silvas, MD;  Location: [facility] ENDOSCOPY;  Service:               Endoscopy;  Laterality: N/A; 09/05/2017: ESOPHAGOGASTRODUODENOSCOPY (EGD) WITH PROPOFOL; N/A     Comment:  Procedure: ESOPHAGOGASTRODUODENOSCOPY (EGD) WITH  PROPOFOL;  Surgeon: Manya Silvas, MD;  Location:               [facility] ENDOSCOPY;  Service: Endoscopy;  Laterality: N/A;  BMI    Body Mass Index: 23.81 kg/m      Reproductive/Obstetrics negative OB ROS                             Anesthesia Physical Anesthesia Plan  ASA: 2  Anesthesia Plan: General   Post-op Pain Management:    Induction: Intravenous  PONV Risk Score and Plan: Propofol infusion and TIVA  Airway Management Planned: Natural Airway and Nasal Cannula  Additional Equipment:   Intra-op Plan:   Post-operative Plan:   Informed Consent: I have reviewed the patients History and Physical, chart, labs and discussed the procedure including the risks, benefits and alternatives for the proposed anesthesia with the patient or authorized representative who has indicated his/her understanding and acceptance.     Dental Advisory Given  Plan Discussed with: Anesthesiologist, CRNA and Surgeon  Anesthesia Plan Comments: (Patient consented for risks of anesthesia including but not limited to:  - adverse  reactions to medications - risk of airway placement if required - damage to eyes, teeth, lips or other oral mucosa - nerve damage due to positioning  - sore throat or hoarseness - Damage to heart, brain, nerves, lungs, other parts of body or loss of life  Patient voiced understanding.)        Anesthesia Quick Evaluation

## 2022-10-09 NOTE — Op Note (Signed)
[facility] Gastroenterology Patient Name: Laura Johns Procedure Date: 10/09/2022 8:13 AM MRN: [id number] Account #: [id number] Date of Birth: December 04, 1951 Admit Type: Outpatient Age: 71 Room: [facility] ENDO ROOM 1 Gender: Female Note Status: Finalized Instrument Name: Peds Colonoscope [id number] Procedure:             Colonoscopy Indications:           Screening for colorectal malignant neoplasm Providers:             Robert Bellow, MD Medicines:             Propofol per Anesthesia Complications:         No immediate complications. Procedure:             Pre-Anesthesia Assessment:                        - Prior to the procedure, a History and Physical was                         performed, and patient medications, allergies and                         sensitivities were reviewed. The patient's tolerance                         of previous anesthesia was reviewed.                        - The risks and benefits of the procedure and the                         sedation options and risks were discussed with the                         patient. All questions were answered and informed                         consent was obtained.                        After obtaining informed consent, the colonoscope was                         passed under direct vision. Throughout the procedure,                         the patient's blood pressure, pulse, and oxygen                         saturations were monitored continuously. The                         Colonoscope was introduced through the anus and                         advanced to the the cecum, identified by appendiceal                         orifice and ileocecal valve. The colonoscopy was  performed without difficulty. The patient tolerated                         the procedure well. The quality of the bowel                         preparation was excellent. Findings:      Multiple medium-mouthed  diverticula were found in the recto-sigmoid       colon, sigmoid colon and descending colon.      The retroflexed view of the distal rectum and anal verge was normal and       showed no anal or rectal abnormalities. Impression:            - Moderate diverticulosis in the recto-sigmoid colon,                         in the sigmoid colon and in the descending colon.                        - The distal rectum and anal verge are normal on                         retroflexion view.                        - No specimens collected. Recommendation:        - Repeat colonoscopy in 10 years for screening                         purposes. Procedure Code(s):     --- Professional ---                        [id number], Colonoscopy, flexible; diagnostic, including                         collection of specimen(s) by brushing or washing, when                         performed (separate procedure) Diagnosis Code(s):     --- Professional ---                        Z12.11, Encounter for screening for malignant neoplasm                         of colon                        K57.30, Diverticulosis of large intestine without                         perforation or abscess without bleeding CPT copyright 2019 American Medical Association. All rights reserved. The codes documented in this report are preliminary and upon coder review may  be revised to meet current compliance requirements. Robert Bellow, MD 10/09/2022 8:27:49 AM This report has been signed electronically. Number of Addenda: 0 Note Initiated On: 10/09/2022 8:13 AM Scope Withdrawal Time: 0 hours 4 minutes 46 seconds  Total Procedure Duration: 0 hours 9 minutes 18 seconds  Estimated Blood Loss:  Estimated blood loss:  none.      [facility]

## 2022-10-10 ENCOUNTER — Encounter: Payer: Self-pay | Admitting: General Surgery

## 2022-12-04 ENCOUNTER — Ambulatory Visit: Payer: Medicare Other | Admitting: Radiation Oncology

## 2022-12-09 ENCOUNTER — Ambulatory Visit: Payer: Medicare Other | Admitting: Radiation Oncology

## 2022-12-19 ENCOUNTER — Ambulatory Visit
Admission: RE | Admit: 2022-12-19 | Discharge: 2022-12-19 | Disposition: A | Discharge status: Home / Self Care | Payer: Medicare Other | Source: Ambulatory Visit | Attending: Radiation Oncology | Admitting: Radiation Oncology

## 2022-12-19 ENCOUNTER — Encounter: Payer: Self-pay | Admitting: Radiation Oncology

## 2022-12-19 VITALS — BP 158/86 | HR 65 | Temp 96.4°F | Ht 61.0 in | Wt 125.0 lb

## 2022-12-19 DIAGNOSIS — Z170 Estrogen receptor positive status [ER+]: Secondary | ICD-10-CM | POA: Diagnosis not present

## 2022-12-19 DIAGNOSIS — C50411 Malignant neoplasm of upper-outer quadrant of right female breast: Secondary | ICD-10-CM | POA: Insufficient documentation

## 2022-12-19 DIAGNOSIS — Z79811 Long term (current) use of aromatase inhibitors: Secondary | ICD-10-CM | POA: Diagnosis not present

## 2022-12-19 DIAGNOSIS — Z923 Personal history of irradiation: Secondary | ICD-10-CM | POA: Diagnosis not present

## 2022-12-19 NOTE — Progress Notes (Signed)
Radiation Oncology Follow up Note  Name: Laura Johns   Date:   12/19/2022 MRN:  [id number] DOB: 06-Nov-1951    This 71 y.o. 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  Seen today in routine follow-up she is doing well.  She specifically denies breast tenderness cough or bone pain..  She had mammograms back in August which I have reviewed were BI-RADS 2 benign.  She is currently on Arimidex tolerating it well without side effect.  COMPLICATIONS OF TREATMENT: none  FOLLOW UP COMPLIANCE: keeps appointments   PHYSICAL EXAM:  BP (!) 158/86   Pulse 65   Temp (!) 96.4 F (35.8 C)   Ht '5\' 1"'$  (1.549 m)   Wt 125 lb (56.7 kg)   BMI 23.62 kg/m  Lungs are clear to A&P cardiac examination essentially unremarkable with regular rate and rhythm. No dominant mass or nodularity is noted in either breast in 2 positions examined. Incision is well-healed. No axillary or supraclavicular adenopathy is appreciated. Cosmetic result is excellent.  Well-developed well-nourished patient in NAD. HEENT reveals PERLA, EOMI, discs not visualized.  Oral cavity is clear. No oral mucosal lesions are identified. Neck is clear without evidence of cervical or supraclavicular adenopathy. Lungs are clear to A&P. Cardiac examination is essentially unremarkable with regular rate and rhythm without murmur rub or thrill. Abdomen is benign with no organomegaly or masses noted. Motor sensory and DTR levels are equal and symmetric in the upper and lower extremities. Cranial nerves II through XII are grossly intact. Proprioception is intact. No peripheral adenopathy or edema is identified. No  motor or sensory levels are noted. Crude visual fields are within normal range.  RADIOLOGY RESULTS: Mammograms reviewed compatible with above-stated findings  PLAN: Present time patient is doing well with no evidence of disease now at 2 years from radiation.  And pleased with her overall progress.  I have asked to see her back in 1 year for follow-up and then will discontinue follow-up care.  She continues on Arimidex without side effect.  Patient is to call with any concerns.  I would like to take this opportunity to thank you for allowing me to participate in the care of your patient.Noreene Filbert, MD

## 2023-01-09 ENCOUNTER — Inpatient Hospital Stay: Payer: Medicare Other | Admitting: Internal Medicine

## 2023-01-09 ENCOUNTER — Inpatient Hospital Stay: Payer: Medicare Other | Attending: Internal Medicine

## 2023-01-09 ENCOUNTER — Encounter: Payer: Self-pay | Admitting: Internal Medicine

## 2023-01-09 VITALS — BP 163/97 | HR 70 | Temp 96.1°F | Resp 18 | Wt 125.4 lb

## 2023-01-09 DIAGNOSIS — C50411 Malignant neoplasm of upper-outer quadrant of right female breast: Secondary | ICD-10-CM | POA: Diagnosis not present

## 2023-01-09 DIAGNOSIS — M8580 Other specified disorders of bone density and structure, unspecified site: Secondary | ICD-10-CM | POA: Insufficient documentation

## 2023-01-09 DIAGNOSIS — Z170 Estrogen receptor positive status [ER+]: Secondary | ICD-10-CM | POA: Diagnosis not present

## 2023-01-09 DIAGNOSIS — Z923 Personal history of irradiation: Secondary | ICD-10-CM | POA: Diagnosis not present

## 2023-01-09 DIAGNOSIS — R030 Elevated blood-pressure reading, without diagnosis of hypertension: Secondary | ICD-10-CM | POA: Diagnosis not present

## 2023-01-09 DIAGNOSIS — Z79811 Long term (current) use of aromatase inhibitors: Secondary | ICD-10-CM | POA: Insufficient documentation

## 2023-01-09 DIAGNOSIS — N951 Menopausal and female climacteric states: Secondary | ICD-10-CM | POA: Diagnosis not present

## 2023-01-09 LAB — CBC WITH DIFFERENTIAL/PLATELET
Abs Immature Granulocytes: 0 10*3/uL (ref 0.00–0.07)
Basophils Absolute: 0 10*3/uL (ref 0.0–0.1)
Basophils Relative: 1 %
Eosinophils Absolute: 0.2 10*3/uL (ref 0.0–0.5)
Eosinophils Relative: 5 %
HCT: 42.3 % (ref 36.0–46.0)
Hemoglobin: 13.8 g/dL (ref 12.0–15.0)
Immature Granulocytes: 0 %
Lymphocytes Relative: 42 %
Lymphs Abs: 1.6 10*3/uL (ref 0.7–4.0)
MCH: 25.6 pg — ABNORMAL LOW (ref 26.0–34.0)
MCHC: 32.6 g/dL (ref 30.0–36.0)
MCV: 78.3 fL — ABNORMAL LOW (ref 80.0–100.0)
Monocytes Absolute: 0.3 10*3/uL (ref 0.1–1.0)
Monocytes Relative: 8 %
Neutro Abs: 1.6 10*3/uL — ABNORMAL LOW (ref 1.7–7.7)
Neutrophils Relative %: 44 %
Platelets: 305 10*3/uL (ref 150–400)
RBC: 5.4 MIL/uL — ABNORMAL HIGH (ref 3.87–5.11)
RDW: 14.1 % (ref 11.5–15.5)
WBC: 3.7 10*3/uL — ABNORMAL LOW (ref 4.0–10.5)
nRBC: 0 % (ref 0.0–0.2)

## 2023-01-09 LAB — COMPREHENSIVE METABOLIC PANEL
ALT: 13 U/L (ref 0–44)
AST: 20 U/L (ref 15–41)
Albumin: 4.2 g/dL (ref 3.5–5.0)
Alkaline Phosphatase: 48 U/L (ref 38–126)
Anion gap: 9 (ref 5–15)
BUN: 15 mg/dL (ref 8–23)
CO2: 28 mmol/L (ref 22–32)
Calcium: 10.1 mg/dL (ref 8.9–10.3)
Chloride: 99 mmol/L (ref 98–111)
Creatinine, Ser: 0.96 mg/dL (ref 0.44–1.00)
GFR, Estimated: >60 mL/min (ref >60)
Glucose, Bld: 86 mg/dL (ref 70–99)
Potassium: 3.6 mmol/L (ref 3.5–5.1)
Sodium: 136 mmol/L (ref 135–145)
Total Bilirubin: 0.8 mg/dL (ref 0.3–1.2)
Total Protein: 7.5 g/dL (ref 6.5–8.1)

## 2023-01-09 NOTE — Progress Notes (Signed)
Patient denies new problems/concerns today.    BP 163/97, HR 70.  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

## 2023-01-09 NOTE — Progress Notes (Signed)
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 Dr.Byrnett; s/p MammoSite.  # MID JAN 2022- START ARIMIDEX  # DEC 2021- BMD- t score -1.8/osteopenia   # SURVIVORSHIP:   # GENETICS:   DIAGNOSIS: Right breast cancer  STAGE:   1      ;  GOALS: Cure  CURRENT/MOST RECENT THERAPY : Radiation; AI    Carcinoma of upper-outer quadrant of right breast in female, estrogen receptor positive ([facility]  10/06/2020 Initial Diagnosis   Carcinoma of upper-outer quadrant of right breast in female, estrogen receptor positive ([facility]   01/10/2021 Cancer Staging   Staging form: Breast, AJCC 8th Edition - Clinical: Stage IA (cT1b, cN0, cM0, G1, ER+, PR+, HER2-) - Signed by BCammie Sickle MD on 01/10/2021    HISTORY OF PRESENTING ILLNESS: Alone.  Ambulating independently.  [name]89y.o.  female patient with right breast cancer ER/PR positive HER-2 negative currently on adjuvant anastrozole is here for follow-up/review results of the bone density.  Patient admits to mild hot flashes.  Denies any worsening joint pains.  No nausea no vomiting.  Abdominal pain.  No new lumps or bumps.  She admits to compliance with her medications.  Review of Systems  Constitutional:  Negative for chills, diaphoresis, fever, malaise/fatigue and weight loss.  HENT:  Negative for nosebleeds and sore throat.   Eyes:  Negative for double vision.  Respiratory:  Negative  for cough, hemoptysis, sputum production, shortness of breath and wheezing.   Cardiovascular:  Negative for chest pain, palpitations, orthopnea and leg swelling.  Gastrointestinal:  Negative for abdominal pain, blood in stool, constipation, diarrhea, heartburn, melena, nausea and vomiting.  Genitourinary:  Negative for dysuria, frequency and urgency.  Musculoskeletal:  Positive for back pain and joint pain.  Skin: Negative.  Negative for itching and rash.  Neurological:  Negative for dizziness, tingling, focal weakness, weakness and headaches.  Endo/Heme/Allergies:  Does not bruise/bleed easily.  Psychiatric/Behavioral:  Negative for depression. The patient is not nervous/anxious and does not have insomnia.      MEDICAL HISTORY:  Past Medical History:  Diagnosis Date   Borderline diabetes mellitus    [place] [place]    2021 right breast ca   Cancer ([facility]    Diverticulitis    Hyperlipemia    Hypertension    Personal history of radiation therapy    2021 mammosite    SURGICAL HISTORY: Past Surgical History:  Procedure Laterality Date   ABDOMINAL HYSTERECTOMY     AXILLARY SENTINEL NODE BIOPSY Right 10/20/2020   Procedure: AXILLARY SENTINEL NODE BIOPSY;  Surgeon: BRobert Bellow MD;  Location: [facility] ORS;  Service: General;  Laterality: Right;   BREAST BIOPSY Right 09/27/2020   GRADE I INVASIVE MAMMARY CARCINOMA   BREAST LUMPECTOMY WITH NEEDLE LOCALIZATION Right 10/20/2020   Procedure: BREAST LUMPECTOMY WITH NEEDLE LOCALIZATION;  Surgeon: BRobert Bellow MD;  Location: [facility] ORS;  Service: General;  Laterality: Right;   CATARACT EXTRACTION W/PHACO Right 08/25/2014   Procedure: CATARACT EXTRACTION PHACO AND INTRAOCULAR LENS  PLACEMENT (IOC);  Surgeon: Tonny Branch, MD;  Location: [facility] ORS;  Service: Ophthalmology;  Laterality: Right;  CDE 5.82   CATARACT EXTRACTION W/PHACO Left 04/14/2020   Procedure: CATARACT EXTRACTION PHACO AND INTRAOCULAR LENS PLACEMENT LEFT EYE CDE=6.73;   Surgeon: Baruch Goldmann, MD;  Location: [facility] ORS;  Service: Ophthalmology;  Laterality: Left;  left   COLONOSCOPY WITH PROPOFOL N/A 09/05/2017   Procedure: COLONOSCOPY WITH PROPOFOL;  Surgeon: Manya Silvas, MD;  Location: [facility] ENDOSCOPY;  Service: Endoscopy;  Laterality: N/A;   COLONOSCOPY WITH PROPOFOL N/A 10/09/2022   Procedure: COLONOSCOPY WITH PROPOFOL;  Surgeon: Robert Bellow, MD;  Location: [facility] ENDOSCOPY;  Service: Endoscopy;  Laterality: N/A;   ESOPHAGOGASTRODUODENOSCOPY (EGD) WITH PROPOFOL N/A 09/05/2017   Procedure: ESOPHAGOGASTRODUODENOSCOPY (EGD) WITH PROPOFOL;  Surgeon: Manya Silvas, MD;  Location: [facility] ENDOSCOPY;  Service: Endoscopy;  Laterality: N/A;    SOCIAL HISTORY: Social History   Socioeconomic History   Marital status: Married    Spouse name: Not on file   Number of children: Not on file   Years of education: Not on file   Highest education level: Not on file  Occupational History   Not on file  Tobacco Use   Smoking status: Never   Smokeless tobacco: Never  Vaping Use   Vaping Use: Never used  Substance and Sexual Activity   Alcohol use: No   Drug use: No   Sexual activity: Yes  Other Topics Concern   Not on file  Social History Narrative   Never smoked; no alcohol; works in Scientist, research (medical). Lives in [place]; [place] address. With husband at home; one daughter- 22 y.    Social Determinants of Health   Financial Resource Strain: Not on file  Food Insecurity: Not on file  Transportation Needs: Not on file  Physical Activity: Not on file  Stress: Not on file  Social Connections: Not on file  Intimate Partner Violence: Not on file    FAMILY HISTORY: Family History  Problem Relation Age of Onset   Stroke Mother    Heart attack Father    Stroke Father    Breast cancer Neg Hx     ALLERGIES:  is allergic to atorvastatin, lovastatin, pravastatin, contrast media [iodinated contrast media], morphine and related, and  nexium [esomeprazole magnesium].  MEDICATIONS:  Current Outpatient Medications  Medication Sig Dispense Refill   anastrozole (ARIMIDEX) 1 MG tablet TAKE 1 TABLET BY MOUTH  DAILY 90 tablet 3   aspirin EC 81 MG tablet Take 81 mg by mouth daily.     cholecalciferol (VITAMIN D3) 25 MCG (1000 UNIT) tablet Take 1,000 Units by mouth daily.     ezetimibe (ZETIA) 10 MG tablet Take 10 mg by mouth every morning.      hydrochlorothiazide (HYDRODIURIL) 25 MG tablet Take 25 mg by mouth every morning.      lisinopril (ZESTRIL) 30 MG tablet Take by mouth.     triamcinolone cream (KENALOG) 0.1 % APPLY TO AFFECTED AREA TOPICALLY ONCE DAILY     No current facility-administered medications for this visit.      Marland Kitchen  PHYSICAL EXAMINATION: ECOG PERFORMANCE STATUS: 0 - Asymptomatic  Vitals:   01/09/23 1100  BP: (!) 163/97  Pulse: 70  Resp: 18  Temp: (!) 96.1 F (35.6 C)   Filed Weights   01/09/23 1100  Weight: 125 lb 6.4 oz (56.9 kg)    Physical Exam HENT:     Head: Normocephalic and atraumatic.     Mouth/Throat:  Pharynx: No oropharyngeal exudate.  Eyes:     Pupils: Pupils are equal, round, and reactive to light.  Cardiovascular:     Rate and Rhythm: Normal rate and regular rhythm.  Pulmonary:     Effort: No respiratory distress.     Breath sounds: No wheezing.  Abdominal:     General: Bowel sounds are normal. There is no distension.     Palpations: Abdomen is soft. There is no mass.     Tenderness: There is no abdominal tenderness. There is no guarding or rebound.  Musculoskeletal:        General: No tenderness. Normal range of motion.     Cervical back: Normal range of motion and neck supple.  Skin:    General: Skin is warm.  Neurological:     Mental Status: She is alert and oriented to person, place, and time.  Psychiatric:        Mood and Affect: Affect normal.      LABORATORY DATA:  I have reviewed the data as listed Lab Results  Component Value Date   WBC 3.7 (L)  01/09/2023   HGB 13.8 01/09/2023   HCT 42.3 01/09/2023   MCV 78.3 (L) 01/09/2023   PLT 305 01/09/2023   Recent Labs    06/05/22 0938 01/09/23 1036  NA 134* 136  K 3.7 3.6  CL 99 99  CO2 29 28  GLUCOSE 92 86  BUN 17 15  CREATININE 0.97 0.96  CALCIUM 9.7 10.1  GFRNONAA >60 >60  PROT 7.6 7.5  ALBUMIN 4.5 4.2  AST 21 20  ALT 14 13  ALKPHOS 52 48  BILITOT 0.8 0.8    RADIOGRAPHIC STUDIES: I have personally reviewed the radiological images as listed and agreed with the findings in the report. No results found.  ASSESSMENT & PLAN:   Carcinoma of upper-outer quadrant of right breast in female, estrogen receptor positive ([place]) [place] ] MUCINOUS RIGHT BREAST CANCER- STAGE I ER/PR-POSITIVE; HER-2 NEGATIVE. S/p Mammosite RT. NO adjuvant chemo.  ON  adjuvant anastrozole [until Jan 2026].  BIL AUG 2023- mammo-WBL [Dr.Byrnett].  Stable.   #Tolerating anastrozole well.  No major side effects noted except for mild hot flashes.  Recommended total of 5 years.  # Hot flashes- G-1; monitor for now. Stable.  # HTN- 160s [white coat]-; blood pressure at home 120s to 130s.  Stable.   # AUG 2023-OSTEOPENIA- STABLE;  T-score of -2.1.  2021-osteopenic T-score of-1.8. [2018-hypercalemia]; continue on the- vit D1000/day; exercise. Discussed the potential risk factors for osteoporosis- age/gender/postmenopausal status/use of anti-estrogen treatments.  Discussed oral bisphosphonates versus parenteral bisphosphonate like Reclast.  Discussed the potential benefits and/side effects  Including but not limited to Osteonecrosis of jaw/ hypocalcemia. Declines for now.    # DISPOSITION: # follow up in 6 month-- MD;labs- cbc/cmp- -Dr.B   All questions were answered. The patient/family knows to call the clinic with any problems, questions or concerns.   Cammie Sickle, MD 01/09/2023 3:54 PM

## 2023-01-09 NOTE — Assessment & Plan Note (Addendum)
#[  Jingyi.Sol ] MUCINOUS RIGHT BREAST CANCER- STAGE I ER/PR-POSITIVE; HER-2 NEGATIVE. S/p Mammosite RT. NO adjuvant chemo.  ON  adjuvant anastrozole [until Jan 2026].  BIL AUG 2023- mammo-WBL [Dr.Byrnett].  Stable.   #Tolerating anastrozole well.  No major side effects noted except for mild hot flashes.  Recommended total of 5 years.  # Hot flashes- G-1; monitor for now. Stable.  # HTN- 160s [white coat]-; blood pressure at home 120s to 130s.  Stable.   # AUG 2023-OSTEOPENIA- STABLE;  T-score of -2.1.  2021-osteopenic T-score of-1.8. [2018-hypercalemia]; continue on the- vit D1000/day; exercise. Discussed the potential risk factors for osteoporosis- age/gender/postmenopausal status/use of anti-estrogen treatments.  Discussed oral bisphosphonates versus parenteral bisphosphonate like Reclast.  Discussed the potential benefits and/side effects  Including but not limited to Osteonecrosis of jaw/ hypocalcemia. Declines for now.    # DISPOSITION: # follow up in 6 month-- MD;labs- cbc/cmp- -Dr.B

## 2023-01-17 DIAGNOSIS — N1831 Chronic kidney disease, stage 3a: Secondary | ICD-10-CM | POA: Insufficient documentation

## 2023-02-16 ENCOUNTER — Other Ambulatory Visit: Payer: Self-pay | Admitting: Internal Medicine

## 2023-04-17 ENCOUNTER — Other Ambulatory Visit: Payer: Self-pay | Admitting: Internal Medicine

## 2023-07-10 ENCOUNTER — Encounter: Payer: Self-pay | Admitting: Nurse Practitioner

## 2023-07-10 ENCOUNTER — Inpatient Hospital Stay: Payer: Medicare Other | Attending: Internal Medicine | Admitting: Nurse Practitioner

## 2023-07-10 ENCOUNTER — Inpatient Hospital Stay: Payer: Medicare Other

## 2023-07-10 VITALS — BP 165/92 | HR 62 | Temp 97.0°F | Wt 129.0 lb

## 2023-07-10 DIAGNOSIS — Z170 Estrogen receptor positive status [ER+]: Secondary | ICD-10-CM | POA: Insufficient documentation

## 2023-07-10 DIAGNOSIS — Z853 Personal history of malignant neoplasm of breast: Secondary | ICD-10-CM | POA: Diagnosis not present

## 2023-07-10 DIAGNOSIS — C50411 Malignant neoplasm of upper-outer quadrant of right female breast: Secondary | ICD-10-CM | POA: Insufficient documentation

## 2023-07-10 DIAGNOSIS — Z79899 Other long term (current) drug therapy: Secondary | ICD-10-CM | POA: Diagnosis not present

## 2023-07-10 DIAGNOSIS — Z923 Personal history of irradiation: Secondary | ICD-10-CM | POA: Insufficient documentation

## 2023-07-10 DIAGNOSIS — Z7982 Long term (current) use of aspirin: Secondary | ICD-10-CM | POA: Diagnosis not present

## 2023-07-10 DIAGNOSIS — Z08 Encounter for follow-up examination after completed treatment for malignant neoplasm: Secondary | ICD-10-CM

## 2023-07-10 DIAGNOSIS — Z79811 Long term (current) use of aromatase inhibitors: Secondary | ICD-10-CM | POA: Diagnosis not present

## 2023-07-10 LAB — CBC WITH DIFFERENTIAL/PLATELET
Abs Immature Granulocytes: 0.02 10*3/uL (ref 0.00–0.07)
Basophils Absolute: 0 10*3/uL (ref 0.0–0.1)
Basophils Relative: 1 %
Eosinophils Absolute: 0.1 10*3/uL (ref 0.0–0.5)
Eosinophils Relative: 2 %
HCT: 41.7 % (ref 36.0–46.0)
Hemoglobin: 13.4 g/dL (ref 12.0–15.0)
Immature Granulocytes: 0 %
Lymphocytes Relative: 33 %
Lymphs Abs: 1.5 10*3/uL (ref 0.7–4.0)
MCH: 25.4 pg — ABNORMAL LOW (ref 26.0–34.0)
MCHC: 32.1 g/dL (ref 30.0–36.0)
MCV: 79.1 fL — ABNORMAL LOW (ref 80.0–100.0)
Monocytes Absolute: 0.3 10*3/uL (ref 0.1–1.0)
Monocytes Relative: 7 %
Neutro Abs: 2.5 10*3/uL (ref 1.7–7.7)
Neutrophils Relative %: 57 %
Platelets: 267 10*3/uL (ref 150–400)
RBC: 5.27 MIL/uL — ABNORMAL HIGH (ref 3.87–5.11)
RDW: 14.1 % (ref 11.5–15.5)
WBC: 4.5 10*3/uL (ref 4.0–10.5)
nRBC: 0 % (ref 0.0–0.2)

## 2023-07-10 LAB — COMPREHENSIVE METABOLIC PANEL
ALT: 14 U/L (ref 0–44)
AST: 20 U/L (ref 15–41)
Albumin: 4.1 g/dL (ref 3.5–5.0)
Alkaline Phosphatase: 46 U/L (ref 38–126)
Anion gap: 10 (ref 5–15)
BUN: 17 mg/dL (ref 8–23)
CO2: 26 mmol/L (ref 22–32)
Calcium: 9.7 mg/dL (ref 8.9–10.3)
Chloride: 101 mmol/L (ref 98–111)
Creatinine, Ser: 0.83 mg/dL (ref 0.44–1.00)
GFR, Estimated: >60 mL/min (ref >60)
Glucose, Bld: 92 mg/dL (ref 70–99)
Potassium: 3.6 mmol/L (ref 3.5–5.1)
Sodium: 137 mmol/L (ref 135–145)
Total Bilirubin: 0.6 mg/dL (ref 0.3–1.2)
Total Protein: 7 g/dL (ref 6.5–8.1)

## 2023-07-10 NOTE — Progress Notes (Signed)
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 Dr.Byrnett; s/p MammoSite.  # MID JAN 2022- START ARIMIDEX  # DEC 2021- BMD- t score -1.8/osteopenia   # SURVIVORSHIP:   # GENETICS:   DIAGNOSIS: Right breast cancer  STAGE:   1      ;  GOALS: Cure  CURRENT/MOST RECENT THERAPY : Radiation; AI    Carcinoma of upper-outer quadrant of right breast in female, estrogen receptor positive ([facility])  10/06/2020 Initial Diagnosis   Carcinoma of upper-outer quadrant of right breast in female, estrogen receptor positive ([facility])   01/10/2021 Cancer Staging   Staging form: Breast, AJCC 8th Edition - Clinical: Stage IA (cT1b, cN0, cM0, G1, ER+, PR+, HER2-) - Signed by Earna Coder, MD on 01/10/2021    HISTORY OF PRESENTING ILLNESS: Alone.  Ambulating independently.  Laura Johns 72 y.o.  female patient with right breast cancer ER/PR positive HER-2 negative currently on adjuvant anastrozole returns to clinic for continued surveillance and follow up. She reports compliance with medication. Denies new bone pain. No breast pain, skin changes.   Review of Systems  Constitutional:  Negative for chills, diaphoresis, fever, malaise/fatigue and weight loss.  HENT:  Negative for nosebleeds and sore throat.   Eyes:  Negative for double vision.  Respiratory:  Negative for cough, hemoptysis, sputum production, shortness of breath and wheezing.    Cardiovascular:  Negative for chest pain, palpitations, orthopnea and leg swelling.  Gastrointestinal:  Negative for abdominal pain, blood in stool, constipation, diarrhea, heartburn, melena, nausea and vomiting.  Genitourinary:  Negative for dysuria, frequency and urgency.  Musculoskeletal:  Positive for back pain and joint pain.  Skin: Negative.  Negative for itching and rash.  Neurological:  Negative for dizziness, tingling, focal weakness, weakness and headaches.  Endo/Heme/Allergies:  Does not bruise/bleed easily.  Psychiatric/Behavioral:  Negative for depression. The patient is not nervous/anxious and does not have insomnia.      MEDICAL HISTORY:  Past Medical History:  Diagnosis Date   Borderline diabetes mellitus    Breast cancer (HCC)    2021 right breast ca   Cancer [facility])    Diverticulitis    Hyperlipemia    Hypertension    Personal history of radiation therapy    2021 mammosite    SURGICAL HISTORY: Past Surgical History:  Procedure Laterality Date   ABDOMINAL HYSTERECTOMY     AXILLARY SENTINEL NODE BIOPSY Right 10/20/2020   Procedure: AXILLARY SENTINEL NODE BIOPSY;  Surgeon: Earline Mayotte, MD;  Location: [facility] ORS;  Service: General;  Laterality: Right;   BREAST BIOPSY Right 09/27/2020   GRADE I INVASIVE MAMMARY CARCINOMA   BREAST LUMPECTOMY WITH NEEDLE LOCALIZATION Right 10/20/2020   Procedure: BREAST LUMPECTOMY WITH NEEDLE LOCALIZATION;  Surgeon: Earline Mayotte, MD;  Location: [facility] ORS;  Service: General;  Laterality: Right;   CATARACT EXTRACTION W/PHACO Right 08/25/2014   Procedure: CATARACT EXTRACTION PHACO AND INTRAOCULAR LENS PLACEMENT (IOC);  Surgeon: Gemma Payor, MD;  Location: [facility] ORS;  Service: Ophthalmology;  Laterality: Right;  CDE 5.82  CATARACT EXTRACTION W/PHACO Left 04/14/2020   Procedure: CATARACT EXTRACTION PHACO AND INTRAOCULAR LENS PLACEMENT LEFT EYE CDE=6.73;  Surgeon: Fabio Pierce, MD;  Location: [facility] ORS;  Service: Ophthalmology;   Laterality: Left;  left   COLONOSCOPY WITH PROPOFOL N/A 09/05/2017   Procedure: COLONOSCOPY WITH PROPOFOL;  Surgeon: Scot Jun, MD;  Location: [facility] ENDOSCOPY;  Service: Endoscopy;  Laterality: N/A;   COLONOSCOPY WITH PROPOFOL N/A 10/09/2022   Procedure: COLONOSCOPY WITH PROPOFOL;  Surgeon: Earline Mayotte, MD;  Location: [facility] ENDOSCOPY;  Service: Endoscopy;  Laterality: N/A;   ESOPHAGOGASTRODUODENOSCOPY (EGD) WITH PROPOFOL N/A 09/05/2017   Procedure: ESOPHAGOGASTRODUODENOSCOPY (EGD) WITH PROPOFOL;  Surgeon: Scot Jun, MD;  Location: [facility] ENDOSCOPY;  Service: Endoscopy;  Laterality: N/A;    SOCIAL HISTORY: Social History   Socioeconomic History   Marital status: Married    Spouse name: Not on file   Number of children: Not on file   Years of education: Not on file   Highest education level: Not on file  Occupational History   Not on file  Tobacco Use   Smoking status: Never   Smokeless tobacco: Never  Vaping Use   Vaping status: Never Used  Substance and Sexual Activity   Alcohol use: No   Drug use: No   Sexual activity: Yes  Other Topics Concern   Not on file  Social History Narrative   Never smoked; no alcohol; works in Engineer, maintenance. Lives in [place]; [place] address. With husband at home; one daughter- 82 y.    Social Determinants of Health   Financial Resource Strain: Not on file  Food Insecurity: Not on file  Transportation Needs: Not on file  Physical Activity: Not on file  Stress: Not on file  Social Connections: Not on file  Intimate Partner Violence: Not on file    FAMILY HISTORY: Family History  Problem Relation Age of Onset   Stroke Mother    Heart attack Father    Stroke Father    Breast cancer Neg Hx     ALLERGIES:  is allergic to atorvastatin, lovastatin, pravastatin, contrast media [iodinated contrast media], morphine and codeine, and nexium [esomeprazole magnesium].  MEDICATIONS:  Current Outpatient  Medications  Medication Sig Dispense Refill   anastrozole (ARIMIDEX) 1 MG tablet TAKE 1 TABLET BY MOUTH DAILY 90 tablet 3   aspirin EC 81 MG tablet Take 81 mg by mouth daily.     cholecalciferol (VITAMIN D3) 25 MCG (1000 UNIT) tablet Take 1,000 Units by mouth daily.     ezetimibe (ZETIA) 10 MG tablet Take 10 mg by mouth every morning.      hydrochlorothiazide (HYDRODIURIL) 25 MG tablet Take 25 mg by mouth every morning.      lisinopril (ZESTRIL) 30 MG tablet Take by mouth.     triamcinolone cream (KENALOG) 0.1 % APPLY TO AFFECTED AREA TOPICALLY ONCE DAILY     No current facility-administered medications for this visit.   PHYSICAL EXAMINATION: ECOG PERFORMANCE STATUS: 0 - Asymptomatic  Vitals:   07/10/23 1007  BP: (!) 190/90  Pulse: 62  Temp: (!) 97 F (36.1 C)   Filed Weights   07/10/23 1007  Weight: 129 lb (58.5 kg)   Physical Exam Constitutional:      Appearance: She is not ill-appearing.  HENT:     Head: Normocephalic and atraumatic.  Cardiovascular:     Rate and Rhythm: Normal rate and regular rhythm.  Pulmonary:     Effort: No respiratory distress.  Breath sounds: No wheezing.  Abdominal:     General: There is no distension.     Palpations: Abdomen is soft.     Tenderness: There is no abdominal tenderness. There is no guarding.  Musculoskeletal:        General: No tenderness.     Cervical back: Normal range of motion and neck supple.  Skin:    General: Skin is warm.     Coloration: Skin is not pale.  Neurological:     Mental Status: She is alert and oriented to person, place, and time.  Psychiatric:        Mood and Affect: Mood and affect normal.        Behavior: Behavior normal.    LABORATORY DATA:  I have reviewed the data as listed Lab Results  Component Value Date   WBC 4.5 07/10/2023   HGB 13.4 07/10/2023   HCT 41.7 07/10/2023   MCV 79.1 (L) 07/10/2023   PLT 267 07/10/2023   Recent Labs    01/09/23 1036 07/10/23 0958  NA 136 137  K 3.6  3.6  CL 99 101  CO2 28 26  GLUCOSE 86 92  BUN 15 17  CREATININE 0.96 0.83  CALCIUM 10.1 9.7  GFRNONAA >60 >60  PROT 7.5 7.0  ALBUMIN 4.2 4.1  AST 20 20  ALT 13 14  ALKPHOS 48 46  BILITOT 0.8 0.6    RADIOGRAPHIC STUDIES: I have personally reviewed the radiological images as listed and agreed with the findings in the report. No results found.  ASSESSMENT & PLAN:   # Carcinoma of upper-outer quadrant of right breast in female, estrogen receptor positive- [place] ] MUCINOUS RIGHT BREAST CANCER- STAGE I ER/PR-POSITIVE; HER-2 NEGATIVE. S/p Mammosite RT. NO adjuvant chemo. ON adjuvant anastrozole [until Jan 2026]. BIL AUG 2023- mammo-WBL [Dr.Byrnett]. Tolerating anastrozole well without major side effects. Continue. Plan for repeat annual mammogram 08/31/23. Given Dr Rutherford Nail retirement we will order.     # Hot flashes- G-1; monitor for now. Stable.   # HTN- 160s [white coat]- blood pressure at home 120s to 130s. Stable.    # AUG 2023- OSTEOPENIA- STABLE;  T-score of -2.1.  2021-osteopenic T-score of-1.8. [2018-hypercalemia]; continue vit D 1000/day & exercise. Discussed the potential risk factors for osteoporosis- age/gender/postmenopausal status/use of anti-estrogen treatments.  Discussed oral bisphosphonates versus parenteral bisphosphonate like Reclast.  Discussed the potential benefits and/side effects Including but not limited to Osteonecrosis of jaw/ hypocalcemia. Declines for now. Plan to repeat 07/2024.    # DISPOSITION: 08/31/23- mammogram 6 mo- labs (cbc, cmp), Dr Donneta Romberg- [place]  No problem-specific Assessment & Plan notes found for this encounter.  All questions were answered. The patient/family knows to call the clinic with any problems, questions or concerns.  Alinda Dooms, NP 07/10/2023

## 2023-09-02 ENCOUNTER — Ambulatory Visit
Admission: RE | Admit: 2023-09-02 | Discharge: 2023-09-02 | Disposition: A | Discharge status: Home / Self Care | Payer: Medicare Other | Source: Ambulatory Visit | Attending: Nurse Practitioner | Admitting: Nurse Practitioner

## 2023-09-02 DIAGNOSIS — Z170 Estrogen receptor positive status [ER+]: Secondary | ICD-10-CM | POA: Diagnosis present

## 2023-09-02 DIAGNOSIS — Z1231 Encounter for screening mammogram for malignant neoplasm of breast: Secondary | ICD-10-CM | POA: Insufficient documentation

## 2023-09-02 DIAGNOSIS — R928 Other abnormal and inconclusive findings on diagnostic imaging of breast: Secondary | ICD-10-CM | POA: Diagnosis not present

## 2023-09-02 DIAGNOSIS — C50411 Malignant neoplasm of upper-outer quadrant of right female breast: Secondary | ICD-10-CM | POA: Insufficient documentation

## 2023-09-03 ENCOUNTER — Other Ambulatory Visit: Payer: Self-pay | Admitting: Nurse Practitioner

## 2023-09-03 DIAGNOSIS — R928 Other abnormal and inconclusive findings on diagnostic imaging of breast: Secondary | ICD-10-CM

## 2023-09-03 NOTE — Progress Notes (Signed)
Called patient and reviewed abnormal mammogram and need for additional imaging. She is in agreement. Orders placed.

## 2023-09-09 ENCOUNTER — Ambulatory Visit
Admission: RE | Admit: 2023-09-09 | Discharge: 2023-09-09 | Disposition: A | Discharge status: Home / Self Care | Payer: Medicare Other | Source: Ambulatory Visit | Attending: Nurse Practitioner | Admitting: Nurse Practitioner

## 2023-09-09 DIAGNOSIS — R928 Other abnormal and inconclusive findings on diagnostic imaging of breast: Secondary | ICD-10-CM | POA: Insufficient documentation

## 2023-12-18 ENCOUNTER — Telehealth: Payer: Self-pay | Admitting: Internal Medicine

## 2023-12-18 NOTE — Telephone Encounter (Signed)
Called patient to inform of appointment change to 1pm due to needing room for a chemo patient. Left voicemail with details.

## 2023-12-29 ENCOUNTER — Other Ambulatory Visit: Payer: Self-pay | Admitting: Internal Medicine

## 2023-12-30 ENCOUNTER — Encounter: Payer: Self-pay | Admitting: Radiation Oncology

## 2023-12-30 ENCOUNTER — Ambulatory Visit
Admission: RE | Admit: 2023-12-30 | Discharge: 2023-12-30 | Disposition: A | Discharge status: Home / Self Care | Payer: Medicare Other | Source: Ambulatory Visit | Attending: Radiation Oncology | Admitting: Radiation Oncology

## 2023-12-30 VITALS — BP 157/89 | HR 71 | Temp 97.0°F | Resp 14 | Ht 61.0 in | Wt 125.2 lb

## 2023-12-30 DIAGNOSIS — C50411 Malignant neoplasm of upper-outer quadrant of right female breast: Secondary | ICD-10-CM | POA: Diagnosis present

## 2023-12-30 DIAGNOSIS — Z79811 Long term (current) use of aromatase inhibitors: Secondary | ICD-10-CM | POA: Insufficient documentation

## 2023-12-30 DIAGNOSIS — Z923 Personal history of irradiation: Secondary | ICD-10-CM | POA: Diagnosis not present

## 2023-12-30 DIAGNOSIS — Z170 Estrogen receptor positive status [ER+]: Secondary | ICD-10-CM | POA: Diagnosis not present

## 2023-12-30 NOTE — Progress Notes (Signed)
 Radiation Oncology Follow up Note  Name: Laura Johns   Date:   12/30/2023 MRN:  [id number] DOB: [birth year]    This 72 y.o. female presents to the clinic today for 3-year follow-up for accelerated partial breast radiation to her right breast for ER positive base of mucinous carcinoma  REFERRING PROVIDER: Alla Amis, MD  HPI: Patient is a 72 year old female now out over 3 years having pleated accelerated partial breast radiation for ER positive invasive mucinous carcinoma.  Seen today in routine follow-up she is doing well.  She specifically denies breast tenderness cough or bone pain.  She is currently on Arimidex  tolerant well without side effect..  She had mammograms back in September which I reviewed were BI-RADS 1 negative  COMPLICATIONS OF TREATMENT: none  FOLLOW UP COMPLIANCE: keeps appointments   PHYSICAL EXAM:  BP (!) 157/89 Comment: Patient states that BP always goes up in a Dr's office  Pulse 71   Temp (!) 97 F (36.1 C) (Tympanic)   Resp 14   Ht 5' 1 (1.549 m)   Wt 125 lb 3.2 oz (56.8 kg)   BMI 23.66 kg/m  Lungs are clear to A&P cardiac examination essentially unremarkable with regular rate and rhythm. No dominant mass or nodularity is noted in either breast in 2 positions examined. Incision is well-healed. No axillary or supraclavicular adenopathy is appreciated. Cosmetic result is excellent.  Well-developed well-nourished patient in NAD. HEENT reveals PERLA, EOMI, discs not visualized.  Oral cavity is clear. No oral mucosal lesions are identified. Neck is clear without evidence of cervical or supraclavicular adenopathy. Lungs are clear to A&P. Cardiac examination is essentially unremarkable with regular rate and rhythm without murmur rub or thrill. Abdomen is benign with no organomegaly or masses noted. Motor sensory and DTR levels are equal and symmetric in the upper and lower extremities. Cranial nerves II through XII are grossly intact. Proprioception is intact.  No peripheral adenopathy or edema is identified. No motor or sensory levels are noted. Crude visual fields are within normal range.  RADIOLOGY RESULTS: Mammograms and ultrasound reviewed compatible with above-stated findings  PLAN: Present time patient is now out over 3 years with no evidence of disease.  She continues follow-up care with surgeon as well as medical oncology.  I will turn follow-up care over to them.  I would be happy to reevaluate her at any time should that be indicated.  I would like to take this opportunity to thank you for allowing me to participate in the care of your patient.SABRA Marcey Penton, MD

## 2024-01-07 ENCOUNTER — Inpatient Hospital Stay: Payer: Medicare Other | Attending: Internal Medicine

## 2024-01-07 ENCOUNTER — Ambulatory Visit: Payer: Medicare Other | Admitting: Internal Medicine

## 2024-01-07 ENCOUNTER — Inpatient Hospital Stay: Payer: Medicare Other | Admitting: Internal Medicine

## 2024-01-07 ENCOUNTER — Other Ambulatory Visit: Payer: Medicare Other

## 2024-01-07 ENCOUNTER — Encounter: Payer: Self-pay | Admitting: Internal Medicine

## 2024-01-07 VITALS — BP 155/85 | HR 86 | Temp 97.3°F | Ht 61.0 in | Wt 129.2 lb

## 2024-01-07 DIAGNOSIS — M8580 Other specified disorders of bone density and structure, unspecified site: Secondary | ICD-10-CM | POA: Diagnosis not present

## 2024-01-07 DIAGNOSIS — Z170 Estrogen receptor positive status [ER+]: Secondary | ICD-10-CM | POA: Diagnosis not present

## 2024-01-07 DIAGNOSIS — Z79811 Long term (current) use of aromatase inhibitors: Secondary | ICD-10-CM | POA: Insufficient documentation

## 2024-01-07 DIAGNOSIS — Z1731 Human epidermal growth factor receptor 2 positive status: Secondary | ICD-10-CM | POA: Diagnosis not present

## 2024-01-07 DIAGNOSIS — Z7982 Long term (current) use of aspirin: Secondary | ICD-10-CM | POA: Diagnosis not present

## 2024-01-07 DIAGNOSIS — C50411 Malignant neoplasm of upper-outer quadrant of right female breast: Secondary | ICD-10-CM | POA: Insufficient documentation

## 2024-01-07 DIAGNOSIS — Z923 Personal history of irradiation: Secondary | ICD-10-CM | POA: Diagnosis not present

## 2024-01-07 DIAGNOSIS — Z1722 Progesterone receptor negative status: Secondary | ICD-10-CM | POA: Insufficient documentation

## 2024-01-07 DIAGNOSIS — Z79899 Other long term (current) drug therapy: Secondary | ICD-10-CM | POA: Insufficient documentation

## 2024-01-07 LAB — CBC WITH DIFFERENTIAL (CANCER CENTER ONLY)
Abs Immature Granulocytes: 0.01 10*3/uL (ref 0.00–0.07)
Basophils Absolute: 0 10*3/uL (ref 0.0–0.1)
Basophils Relative: 1 %
Eosinophils Absolute: 0.2 10*3/uL (ref 0.0–0.5)
Eosinophils Relative: 5 %
HCT: 40.6 % (ref 36.0–46.0)
Hemoglobin: 13.1 g/dL (ref 12.0–15.0)
Immature Granulocytes: 0 %
Lymphocytes Relative: 41 %
Lymphs Abs: 1.6 10*3/uL (ref 0.7–4.0)
MCH: 25.5 pg — ABNORMAL LOW (ref 26.0–34.0)
MCHC: 32.3 g/dL (ref 30.0–36.0)
MCV: 79 fL — ABNORMAL LOW (ref 80.0–100.0)
Monocytes Absolute: 0.3 10*3/uL (ref 0.1–1.0)
Monocytes Relative: 8 %
Neutro Abs: 1.7 10*3/uL (ref 1.7–7.7)
Neutrophils Relative %: 45 %
Platelet Count: 258 10*3/uL (ref 150–400)
RBC: 5.14 MIL/uL — ABNORMAL HIGH (ref 3.87–5.11)
RDW: 14.1 % (ref 11.5–15.5)
WBC Count: 3.9 10*3/uL — ABNORMAL LOW (ref 4.0–10.5)
nRBC: 0 % (ref 0.0–0.2)

## 2024-01-07 LAB — CMP (CANCER CENTER ONLY)
ALT: 14 U/L (ref 0–44)
AST: 19 U/L (ref 15–41)
Albumin: 4 g/dL (ref 3.5–5.0)
Alkaline Phosphatase: 45 U/L (ref 38–126)
Anion gap: 8 (ref 5–15)
BUN: 16 mg/dL (ref 8–23)
CO2: 28 mmol/L (ref 22–32)
Calcium: 9.9 mg/dL (ref 8.9–10.3)
Chloride: 102 mmol/L (ref 98–111)
Creatinine: 0.89 mg/dL (ref 0.44–1.00)
GFR, Estimated: >60 mL/min (ref >60)
Glucose, Bld: 115 mg/dL — ABNORMAL HIGH (ref 70–99)
Potassium: 3.6 mmol/L (ref 3.5–5.1)
Sodium: 138 mmol/L (ref 135–145)
Total Bilirubin: 0.5 mg/dL (ref 0.0–1.2)
Total Protein: 6.7 g/dL (ref 6.5–8.1)

## 2024-01-07 MED ORDER — ANASTROZOLE 1 MG PO TABS: 1.0000 mg | ORAL_TABLET | Freq: Every day | ORAL | 1 refills | Status: DC

## 2024-01-07 NOTE — Progress Notes (Signed)
 Refill anastrozole, pended.

## 2024-01-07 NOTE — Addendum Note (Signed)
 Addended by: Clydia Llano on: 01/07/2024 01:56 PM   Modules accepted: Orders

## 2024-01-07 NOTE — Assessment & Plan Note (Addendum)
#[  FELIPE.FOLKS ] MUCINOUS RIGHT BREAST CANCER- STAGE I ER/PR-POSITIVE; HER-2 NEGATIVE. S/p Mammosite RT. NO adjuvant chemo.  ON  adjuvant anastrozole  [until Jan 2026].  BIL SEP 2024- mammo-WNL [[facility].  Stable.   #Tolerating anastrozole  well.  No major side effects noted except for mild hot flashes.  Recommended total of 5 years.  # Hot flashes- G-1; monitor for now. Stable.   # HTN- 160s [white coat]-; blood pressure at home 120s to 130s.  Stable.   # AUG 2023-OSTEOPENIA- STABLE;  T-score of -2.1.  2021-osteopenic T-score of-1.8. [2018-hypercalemia]; continue on the- vit D1000/day; exercise.  Declined reclast. Will repeat BMD in aug 2025. Will order at next visit.   # Mild leukopenia question benign ethnic/no neutropenia.  Monitor for now.   # DISPOSITION: # follow up in 6 month-- MD;labs- cbc/cmp- -Dr.B

## 2024-01-07 NOTE — Progress Notes (Signed)
 [facility] CONSULT NOTE  Patient Care Team: Alla Amis, MD as PCP - General (Family Medicine) Dannielle Arlean FALCON, RN (Inactive) as Oncology Nurse Navigator (Oncology) Rennie Cindy SAUNDERS, MD as Consulting Physician (Internal Medicine) Dessa Reyes ORN, MD as Consulting Physician (General Surgery) Lenn Aran, MD as Referring Physician (Radiation Oncology)  CHIEF COMPLAINTS/PURPOSE OF CONSULTATION: Breast cancer  #  Oncology History Overview Note  # RIGHT BREAST MUCINOUS CARCINOMA: ER/PR-POSITIVE; Her 2 NEG. PT1apN0 [ 5mm in Biopsy; 3 mm in final resection]; G-1. Dr.Byrnett; s/p MammoSite.  # MID JAN 2022- START ARIMIDEX   # DEC 2021- BMD- t score -1.8/osteopenia   # SURVIVORSHIP:   # GENETICS:   DIAGNOSIS: Right breast cancer  STAGE:   1      ;  GOALS: Cure  CURRENT/MOST RECENT THERAPY : Radiation; AI    Carcinoma of upper-outer quadrant of right breast in female, estrogen receptor positive (HCC)  10/06/2020 Initial Diagnosis   Carcinoma of upper-outer quadrant of right breast in female, estrogen receptor positive (HCC)   01/10/2021 Cancer Staging   Staging form: Breast, AJCC 8th Edition - Clinical: Stage IA (cT1b, cN0, cM0, G1, ER+, PR+, HER2-) - Signed by Havish Petties R, MD on 01/10/2021    HISTORY OF PRESENTING ILLNESS: Alone.  Ambulating independently.  Laura Johns 73 y.o.  female patient with right breast cancer ER/PR positive HER-2 negative currently on adjuvant anastrozole  is here for follow-up/review results of the mammogram.   Patient admits to mild hot flashes.  Denies any worsening joint pains.  No nausea no vomiting.  Abdominal pain.  No new lumps or bumps.  She admits to compliance with her medications.  Review of Systems  Constitutional:  Negative for chills, diaphoresis, fever, malaise/fatigue and weight loss.  HENT:  Negative for nosebleeds and sore throat.   Eyes:  Negative for double vision.  Respiratory:  Negative  for cough, hemoptysis, sputum production, shortness of breath and wheezing.   Cardiovascular:  Negative for chest pain, palpitations, orthopnea and leg swelling.  Gastrointestinal:  Negative for abdominal pain, blood in stool, constipation, diarrhea, heartburn, melena, nausea and vomiting.  Genitourinary:  Negative for dysuria, frequency and urgency.  Musculoskeletal:  Positive for back pain and joint pain.  Skin: Negative.  Negative for itching and rash.  Neurological:  Negative for dizziness, tingling, focal weakness, weakness and headaches.  Endo/Heme/Allergies:  Does not bruise/bleed easily.  Psychiatric/Behavioral:  Negative for depression. The patient is not nervous/anxious and does not have insomnia.      MEDICAL HISTORY:  Past Medical History:  Diagnosis Date   Borderline diabetes mellitus    Breast cancer (HCC)    2021 right breast ca   Cancer [facility])    Diverticulitis    Hyperlipemia    Hypertension    Personal history of radiation therapy    2021 mammosite    SURGICAL HISTORY: Past Surgical History:  Procedure Laterality Date   ABDOMINAL HYSTERECTOMY     AXILLARY SENTINEL NODE BIOPSY Right 10/20/2020   Procedure: AXILLARY SENTINEL NODE BIOPSY;  Surgeon: Dessa Reyes ORN, MD;  Location: [facility] ORS;  Service: General;  Laterality: Right;   BREAST BIOPSY Right 09/27/2020   GRADE I INVASIVE MAMMARY CARCINOMA   BREAST LUMPECTOMY WITH NEEDLE LOCALIZATION Right 10/20/2020   Procedure: BREAST LUMPECTOMY WITH NEEDLE LOCALIZATION;  Surgeon: Dessa Reyes ORN, MD;  Location: [facility] ORS;  Service: General;  Laterality: Right;   CATARACT EXTRACTION W/PHACO Right 08/25/2014   Procedure: CATARACT EXTRACTION PHACO AND INTRAOCULAR LENS  PLACEMENT (IOC);  Surgeon: Cherene Mania, MD;  Location: [facility] ORS;  Service: Ophthalmology;  Laterality: Right;  CDE 5.82   CATARACT EXTRACTION W/PHACO Left 04/14/2020   Procedure: CATARACT EXTRACTION PHACO AND INTRAOCULAR LENS PLACEMENT LEFT EYE CDE=6.73;   Surgeon: Harrie Agent, MD;  Location: [facility] ORS;  Service: Ophthalmology;  Laterality: Left;  left   COLONOSCOPY WITH PROPOFOL  N/A 09/05/2017   Procedure: COLONOSCOPY WITH PROPOFOL ;  Surgeon: Viktoria Lamar DASEN, MD;  Location: [facility] ENDOSCOPY;  Service: Endoscopy;  Laterality: N/A;   COLONOSCOPY WITH PROPOFOL  N/A 10/09/2022   Procedure: COLONOSCOPY WITH PROPOFOL ;  Surgeon: Dessa Reyes ORN, MD;  Location: [facility] ENDOSCOPY;  Service: Endoscopy;  Laterality: N/A;   ESOPHAGOGASTRODUODENOSCOPY (EGD) WITH PROPOFOL  N/A 09/05/2017   Procedure: ESOPHAGOGASTRODUODENOSCOPY (EGD) WITH PROPOFOL ;  Surgeon: Viktoria Lamar DASEN, MD;  Location: [facility] ENDOSCOPY;  Service: Endoscopy;  Laterality: N/A;    SOCIAL HISTORY: Social History   Socioeconomic History   Marital status: Married    Spouse name: Not on file   Number of children: Not on file   Years of education: Not on file   Highest education level: Not on file  Occupational History   Not on file  Tobacco Use   Smoking status: Never   Smokeless tobacco: Never  Vaping Use   Vaping status: Never Used  Substance and Sexual Activity   Alcohol use: No   Drug use: No   Sexual activity: Yes  Other Topics Concern   Not on file  Social History Narrative   Never smoked; no alcohol; works in engineer, maintenance. Lives in [place] address. With husband at home; one daughter- 65 y.    Social Drivers of Corporate Investment Banker Strain: Low Risk  (08/13/2023)   Received from [facility]   Overall Financial Resource Strain (CARDIA)    Difficulty of Paying Living Expenses: Not hard at all  Food Insecurity: No Food Insecurity (08/13/2023)   Received from [facility]   Hunger Vital Sign    Worried About Running Out of Food in the Last Year: Never true    Ran Out of Food in the Last Year: Never true  Transportation Needs: No Transportation Needs (08/13/2023)   Received from [facility] - Transportation    In the past 12 months, has lack of transportation kept you from medical appointments or from getting medications?: No    Lack of Transportation (Non-Medical): No  Physical Activity: Not on file  Stress: Not on file  Social Connections: Not on file  Intimate Partner Violence: Not on file    FAMILY HISTORY: Family History  Problem Relation Age of Onset   Stroke Mother    Heart attack Father    Stroke Father    Breast cancer Neg Hx     ALLERGIES:  is allergic to atorvastatin, lovastatin, pravastatin, contrast media [iodinated contrast media], morphine  and codeine, and nexium [esomeprazole magnesium].  MEDICATIONS:  Current Outpatient Medications  Medication Sig Dispense Refill   aspirin  EC 81 MG tablet Take 81 mg by mouth daily.     cholecalciferol (VITAMIN D3) 25 MCG (1000 UNIT) tablet Take 1,000 Units by mouth daily.     clobetasol cream (TEMOVATE) 0.05 % Apply to affected three times a week     ezetimibe (ZETIA) 10 MG tablet Take 10 mg by mouth every morning.      hydrochlorothiazide  (HYDRODIURIL ) 25 MG tablet Take 25 mg by mouth every morning.  lisinopril  (ZESTRIL ) 30 MG tablet Take by mouth.     triamcinolone  cream (KENALOG ) 0.1 % APPLY TO AFFECTED AREA TOPICALLY ONCE DAILY     anastrozole  (ARIMIDEX ) 1 MG tablet Take 1 tablet (1 mg total) by mouth daily. 90 tablet 1   No current facility-administered medications for this visit.      SABRA  PHYSICAL EXAMINATION: ECOG PERFORMANCE STATUS: 0 - Asymptomatic  Vitals:   01/07/24 1303 01/07/24 1315  BP: (!) 167/95 (!) 155/85  Pulse: 86   Temp: (!) 97.3 F (36.3 C)   SpO2: 100%    Filed Weights   01/07/24 1303  Weight: 129 lb 3.2 oz (58.6 kg)    Physical Exam HENT:     Head: Normocephalic and atraumatic.     Mouth/Throat:     Pharynx: No oropharyngeal exudate.  Eyes:     Pupils: Pupils are equal, round, and reactive to light.  Cardiovascular:     Rate and Rhythm: Normal rate  and regular rhythm.  Pulmonary:     Effort: No respiratory distress.     Breath sounds: No wheezing.  Abdominal:     General: Bowel sounds are normal. There is no distension.     Palpations: Abdomen is soft. There is no mass.     Tenderness: There is no abdominal tenderness. There is no guarding or rebound.  Musculoskeletal:        General: No tenderness. Normal range of motion.     Cervical back: Normal range of motion and neck supple.  Skin:    General: Skin is warm.  Neurological:     Mental Status: She is alert and oriented to person, place, and time.  Psychiatric:        Mood and Affect: Affect normal.      LABORATORY DATA:  I have reviewed the data as listed Lab Results  Component Value Date   WBC 3.9 (L) 01/07/2024   HGB 13.1 01/07/2024   HCT 40.6 01/07/2024   MCV 79.0 (L) 01/07/2024   PLT 258 01/07/2024   Recent Labs    01/09/23 1036 07/10/23 0958 01/07/24 1249  NA 136 137 138  K 3.6 3.6 3.6  CL 99 101 102  CO2 28 26 28   GLUCOSE 86 92 115*  BUN 15 17 16   CREATININE 0.96 0.83 0.89  CALCIUM 10.1 9.7 9.9  GFRNONAA >60 >60 >60  PROT 7.5 7.0 6.7  ALBUMIN 4.2 4.1 4.0  AST 20 20 19   ALT 13 14 14   ALKPHOS 48 46 45  BILITOT 0.8 0.6 0.5    RADIOGRAPHIC STUDIES: I have personally reviewed the radiological images as listed and agreed with the findings in the report. No results found.  ASSESSMENT & PLAN:   Carcinoma of upper-outer quadrant of right breast in female, estrogen receptor positive (HCC) DASH.CUSTARD ] MUCINOUS RIGHT BREAST CANCER- STAGE I ER/PR-POSITIVE; HER-2 NEGATIVE. S/p Mammosite RT. NO adjuvant chemo.  ON  adjuvant anastrozole  [until Jan 2026].  BIL SEP 2024- mammo-WNL [CC.  Stable.   #Tolerating anastrozole  well.  No major side effects noted except for mild hot flashes.  Recommended total of 5 years.  # Hot flashes- G-1; monitor for now. Stable.   # HTN- 160s [white coat]-; blood pressure at home 120s to 130s.  Stable.   # AUG  2023-OSTEOPENIA- STABLE;  T-score of -2.1.  2021-osteopenic T-score of-1.8. [2018-hypercalemia]; continue on the- vit D1000/day; exercise.  Declined reclast. Will repeat BMD in aug 2025. Will order at next visit.   #  Mild leukopenia question benign ethnic/no neutropenia.  Monitor for now.   # DISPOSITION: # follow up in 6 month-- MD;labs- cbc/cmp- -Dr.B   All questions were answered. The patient/family knows to call the clinic with any problems, questions or concerns.   Cindy JONELLE Joe, MD 01/07/2024 1:39 PM

## 2024-05-11 NOTE — Progress Notes (Signed)
 See result notes on 05/07/24

## 2024-05-18 NOTE — Progress Notes (Signed)
 CCM FOLLOW UP:   [facility] Chronic Care Management Follow Up   Laura Johns is a 73 y.o. female who has consented for Chronic Care Management as of 09/22/2019. telephone visit per referral from Alla Amis, MD. The patient's chronic conditions include Hypertension, Hyperlipidemia    PCP: Alla Amis, MD Practice: [facility]         Patient Active Problem List  Diagnosis  . Pure hypercholesterolemia (LDL 155 - 06/27/22)  . Essential hypertension, benign  . Borderline diabetes (A1c 5.9% - 06/27/22) - diet controlled  . Medicare annual wellness visit, initial 02/03/20  . DNR (do not resuscitate)  . History of breast cancer (right - 2021) - followed by Dr. Onnie  . Medicare annual wellness visit, subsequent 02/15/21     Allergies   Allergies  Allergen Reactions  . Iodinated Contrast Media Rash  . Lipitor [Atorvastatin] Muscle Pain  . Lovastatin Muscle Pain    Myalgias and muscle cramps in legs  . Morphine  Anxiety  . Nexium [Esomeprazole Magnesium] Rash  . Pravastatin Muscle Pain  . Codeine Anxiety    Current Medications   Prior to Admission medications   Medication Sig Taking? Last Dose  anastrozole  (ARIMIDEX ) 1 mg tablet Take 1 mg by mouth once daily       aspirin  81 MG EC tablet Take 81 mg by mouth once daily.    cholecalciferol (VITAMIN D3) 1,000 unit tablet Take 1,000 Units by mouth once daily     clobetasoL (TEMOVATE) 0.05 % cream Apply to affected two times a week    desonide (DESOWEN) 0.05 % ointment APPLY TO AFFECTED AREA OF GROIN UP TO TWICE DAILY AS NEEDED FOR ITCHING AND RASH    doxepin (ZONALON) 5 % cream Apply topically 3 (three) times daily    ezetimibe (ZETIA) 10 mg tablet TAKE 1 TABLET BY MOUTH ONCE  DAILY    hydroCHLOROthiazide  (HYDRODIURIL ) 25 MG tablet TAKE 1 TABLET BY MOUTH ONCE  DAILY    lidocaine  (XYLOCAINE ) 2 % jelly Apply topically 3 (three) times daily    lisinopriL  (ZESTRIL ) 40 MG tablet Take 1  tablet (40 mg total) by mouth once daily    loratadine-pseudoephedrine (CLARITIN-D 24-HOUR) 10-240 mg ER tablet Take 1 tablet by mouth once daily    miconazole/cleanser 17 on wipe (MONISTAT 7 CREAM, APPL, WIPES VAGL) Place vaginally Prn    rosuvastatin (CRESTOR) 5 MG tablet Take 1 tablet (5 mg total) by mouth once daily      Any provider appointments since last CCM visit? 12/04/2024- OBGYN- Annual Exam  No med changes 01/07/2024- Oncology- Carcinoma of upper-outer quadrant of right breast in female, estrogen receptor positive  No med changes 02/23/2024- OBGYN- Lichen simplex chronicus  doxepin HCl 5 % <redacted file path> Topical 3 times Daily lidocaine  HCl 2 % <redacted file path> Topical 3 times Daily 03/06/025- OBGYN- Lichen simplex chronicus  No med changes  03/04/2024- Internal Med- Essential hypertension, benign  rosuvastatin calcium <redacted file path> 5 mg Oral Daily lisinopril   (Dose adjustment) Patient not taking: Reported on 09/09/2023 40 mg Oral Daily <redacted file path> 04/06/2024- OBGYN- Lichen simplex chronicus  loratadine/pseudoephedrine 10-240 mg <redacted file path> 1 tablet Oral Daily 05/07/2024- Internal Med- Acute right lower quadrant pain    (Therapy completed) Any recent hospitalizations or ED visits? No  Any missed medication doses? No  Any changes to medications including OTC/herbal products? No  Any requested refills? No  Any new medication access issues? No  Star Medications: (  last fill date and qty) lisinopriL  (ZESTRIL ) 40 MG tablet- Take 1 tablet (40 mg total) by mouth once daily- 03/04/2024- 100 days rosuvastatin (CRESTOR) 5 MG tablet- Take 1 tablet (5 mg total) by mouth once daily- 03/04/2024- 100 days.  Care gaps:  Assessment   Hypertension:  Current medications: aspirin  81 MG EC tablet  hydroCHLOROthiazide  (HYDRODIURIL ) 25 MG tablet  ezetimibe (ZETIA) 10 mg tablet  lisinopriL  (ZESTRIL ) 40 MG tablet  rosuvastatin (CRESTOR) 5 MG tablet    Patient reported BP (home readings): unsure  Monitoring frequency: n/a  Method: (wrist or arm). N/a Hypertensive symptoms: none reported  Hypotensive symptoms: none reported  BP Readings from Last 1 Encounters:  05/07/24 (!) 166/96   Follow-up   Future Appointments     Date/Time Provider Department Center Visit Type   06/10/2024 7:15 AM [facility] C LAB   06/17/2024 11:45 AM Alla Amis, MD [facility] MARYL BROCKS [facility] OFFICE VISIT   12/10/2024 8:30 AM Orlando Comer Hoot, NP [facility] C PHYSICAL      Summary:  Contacted patient and she has been doing well. Patient did inform me of recent office visit. She is no longer having abd pain. Patient is aware to contact us  back if symptoms return or worsen. No reports of hypo/hypertensive symptoms. No other concerns at this time.  CCM Time Entry  Chart Review: 10 min   Patient Call: 2 min   Documentation: 5 min   Total: 17 min

## 2024-05-27 ENCOUNTER — Other Ambulatory Visit: Payer: Self-pay | Admitting: Internal Medicine

## 2024-05-27 DIAGNOSIS — C50411 Malignant neoplasm of upper-outer quadrant of right female breast: Secondary | ICD-10-CM

## 2024-06-17 NOTE — Progress Notes (Signed)
 Chief Complaint  Patient presents with  . Follow-up    HPI  LOCKIE BOTHUN is a 73 y.o. here for follow up of chronic medical issues  HTN: No acute issues.  Tolerating meds without adverse effects.  Home avg bp 130s/70s-80s.  Denies HAs, vision changes, dizziness, cp, or syncopal episodes.  HLD: No acute issues.  Tolerating medications without adverse effects.  Denies any myalgia.  No cp or CVA symptoms.   Borderline DM: No acute issues.  No meds.  Asymptomatic.  Not checking CBGs.  CKD: No issues.  Tolerating lisinopril .  Has increased water intake.  Increased gas production: Patient reports increased gas production.  Unsure of what foods cause it.  Has occasional intermittent right side abdominal pain.  No vomiting or nausea.  No diarrhea or rectal bleeding.  ROS Review of systems is unremarkable for any active cardiac, respiratory, GI, GU, hematologic, neurologic, dermatologic, HEENT, or psychiatric symptoms except as noted above.  No fevers, chills, or constitutional symptoms.   Current Outpatient Medications  Medication Sig Dispense Refill  . anastrozole  (ARIMIDEX ) 1 mg tablet Take 1 mg by mouth once daily       . aspirin  81 MG EC tablet Take 81 mg by mouth once daily.    . cholecalciferol (VITAMIN D3) 1,000 unit tablet Take 1,000 Units by mouth once daily     . clobetasoL (TEMOVATE) 0.05 % cream Apply to affected two times a week 60 g 1  . desonide (DESOWEN) 0.05 % ointment APPLY TO AFFECTED AREA OF GROIN UP TO TWICE DAILY AS NEEDED FOR ITCHING AND RASH    . doxepin (ZONALON) 5 % cream Apply topically 3 (three) times daily 30 g 0  . ezetimibe (ZETIA) 10 mg tablet TAKE 1 TABLET BY MOUTH ONCE  DAILY 100 tablet 2  . hydroCHLOROthiazide  (HYDRODIURIL ) 25 MG tablet TAKE 1 TABLET BY MOUTH ONCE  DAILY 100 tablet 2  . lidocaine  (XYLOCAINE ) 2 % jelly Apply topically 3 (three) times daily 30 mL 0  . lisinopriL  (ZESTRIL ) 40 MG tablet Take 1 tablet (40 mg total) by mouth once daily 100  tablet 1  . loratadine-pseudoephedrine (CLARITIN-D 24-HOUR) 10-240 mg ER tablet Take 1 tablet by mouth once daily (Patient taking differently: Take 1 tablet by mouth once daily as needed for Allergies or Nasal Congestion) 30 tablet 11  . miconazole/cleanser 17 on wipe (MONISTAT 7 CREAM, APPL, WIPES VAGL) Place vaginally Prn    . rosuvastatin (CRESTOR) 5 MG tablet Take 1 tablet (5 mg total) by mouth once daily 14 tablet 0   No current facility-administered medications for this visit.    Allergies as of 06/17/2024 - Reviewed 06/17/2024  Allergen Reaction Noted  . Iodinated contrast media Rash 08/18/2017  . Lipitor [atorvastatin] Muscle Pain 06/30/2014  . Lovastatin Muscle Pain 06/11/2017  . Morphine  Anxiety 10/02/2020  . Nexium [esomeprazole magnesium] Rash 06/30/2014  . Pravastatin Muscle Pain 07/15/2017  . Codeine Anxiety 07/17/2017    Patient Active Problem List  Diagnosis  . Pure hypercholesterolemia (LDL 88 - 06/10/24)  . Essential hypertension, benign  . Borderline diabetes (A1c 5.8% - 06/10/24) - diet controlled  . Medicare annual wellness visit, initial 02/03/20  . DNR (do not resuscitate)  . History of breast cancer (right - 2021) - followed by Dr. Onnie  . Medicare annual wellness visit, subsequent 03/04/24  . Stage 3a chronic kidney disease (Cr 1, GFR 59 - 06/10/24)  . Lichen simplex chronicus    Past Medical History:  Diagnosis Date  .  Clostridium difficile infection 08/01/2017   Treated with Flagyl   . Diverticulitis   . Essential hypertension, benign 06/30/2014  . History of radiation therapy   . Malignant neoplasm of lower-outer quadrant of right breast of female, estrogen receptor positive (CMS/HHS-HCC) 10/20/2020   T1 a, N0; ER positive, PR positive, HER-2/neu not overexpressed.  Minimal margin for invasive cancer 2 mm, DCIS 5 mm.  . Other and unspecified angina pectoris () 06/30/2014  . Other and unspecified hyperlipidemia 06/30/2014    Past Surgical History:   Procedure Laterality Date  . HYSTERECTOMY  1998   cervix still present  . CATARACT EXTRACTION Right 08/25/2014  . COLONOSCOPY  09/05/2017   Int Hemorrhoids, Diveticulosis: No repeat per RTE  . EGD  09/05/2017  . CATARACT EXTRACTION Left 03/2020  . cataract surgery Left 04/14/2020  . INCISIONAL BIOPSY BREAST Right 09/27/2020  . MASTECTOMY PARTIAL / LUMPECTOMY Right 10/20/2020   right lumpectomy  . COLONOSCOPY  06/19/2007, 07/14/1996   Normal Colon: CBF 05/2017    Vitals:   06/17/24 1136 06/17/24 1141  BP: (!) 180/96 (!) 158/88  Pulse: 71   SpO2: 98%   Weight: 58.4 kg (128 lb 12.8 oz)   Height: 154.9 cm (5' 1)   PainSc:   4   PainLoc: Abdomen    Body mass index is 24.34 kg/m.  Exam  General. Well appearing; NAD; VS reviewed     Eyes. Sclera and conjunctiva clear; Vision grossly intact; extraocular movements intact Neck. Supple.  Lungs. Respirations unlabored; clear to auscultation bilaterally Cardiovascular. Heart regular rate and rhythm without murmurs, gallops, or rubs Abdomen. Soft; non tender; non distended; no masses or organomegaly Extremities. no edema Skin. Normal color and turgor Neurologic. Alert and oriented x3; CN 2-12 grossly intact; no focal deficits  Assessment and Plan  1. Essential hypertension, benign Acutely elevated but well-controlled at home.  Kidney function improving with creatinine 1 and GFR 59.  Electrolytes within normal limits.  No changes to current regimen.  Monitor blood pressure twice a week with goal less than 140/90. -     Comprehensive Metabolic Panel (CMP); Future -     CBC w/auto Differential (5 Part); Future  2. Stage 3a chronic kidney disease (Cr 1, GFR 59 - 06/10/24) Improving.  Avoid nephrotoxic agents and focus on hydration.  Continue with lisinopril  for renal protection.  3. Borderline diabetes (A1c 5.8% - 06/10/24) - diet controlled Stable off meds.  Focus on lifestyle modifications.  No need to check CBGs. -     Hemoglobin  A1C; Future  4. Pure hypercholesterolemia (LDL 88 - 06/10/24) Well controlled.  LFTs wnls (05/2024). Continue with current regimen.  Counseled on mediterranean diet and aerobic exercise.  Provided short supply of Crestor until her mail order can provide the rest of her medication. -     Lipid Panel w/calc LDL; Future  5. Intestinal gas excretion Acute.  Recommend probiotic.  Avoid foods that trigger symptoms.  Other orders -     Follow up in Primary Care -     rosuvastatin (CRESTOR) 5 MG tablet; Take 1 tablet (5 mg total) by mouth once daily -     Follow up in Primary Care; Future  F/u 6 months for PE.  Labs prior  ALDA CARPEN, MD

## 2024-07-01 NOTE — Progress Notes (Signed)
 No chief complaint on file.   HPI  Laura Johns is a 73 y.o. here for an acute issue.  She has a PMH of HTN, HLD, DM2, CKD, breast cancer who removed a tick on her left lower extremity over a week ago.  Still some redness but otherwise stable.  No fevers or chills.  No pain to the site.  Rash has improved and she has applied cortisone.    ROS  Pertinent items are noted in HPI.  Outpatient Encounter Medications as of 07/01/2024  Medication Sig Dispense Refill  . anastrozole  (ARIMIDEX ) 1 mg tablet Take 1 mg by mouth once daily       . aspirin  81 MG EC tablet Take 81 mg by mouth once daily.    . cholecalciferol (VITAMIN D3) 1,000 unit tablet Take 1,000 Units by mouth once daily     . clobetasoL (TEMOVATE) 0.05 % cream Apply to affected two times a week 60 g 1  . desonide (DESOWEN) 0.05 % ointment APPLY TO AFFECTED AREA OF GROIN UP TO TWICE DAILY AS NEEDED FOR ITCHING AND RASH    . doxepin (ZONALON) 5 % cream Apply topically 3 (three) times daily 30 g 0  . ezetimibe (ZETIA) 10 mg tablet TAKE 1 TABLET BY MOUTH ONCE  DAILY 100 tablet 2  . hydroCHLOROthiazide  (HYDRODIURIL ) 25 MG tablet TAKE 1 TABLET BY MOUTH ONCE  DAILY 100 tablet 2  . lidocaine  (XYLOCAINE ) 2 % jelly Apply topically 3 (three) times daily 30 mL 0  . lisinopriL  (ZESTRIL ) 40 MG tablet Take 1 tablet (40 mg total) by mouth once daily 100 tablet 1  . loratadine-pseudoephedrine (CLARITIN-D 24-HOUR) 10-240 mg ER tablet Take 1 tablet by mouth once daily (Patient taking differently: Take 1 tablet by mouth once daily as needed for Allergies or Nasal Congestion) 30 tablet 11  . miconazole/cleanser 17 on wipe (MONISTAT 7 CREAM, APPL, WIPES VAGL) Place vaginally Prn    . rosuvastatin (CRESTOR) 5 MG tablet Take 1 tablet (5 mg total) by mouth once daily 100 tablet 1   No facility-administered encounter medications on file as of 07/01/2024.    Allergies as of 07/01/2024 - Reviewed 06/17/2024  Allergen Reaction Noted  . Iodinated contrast  media Rash 08/18/2017  . Lipitor [atorvastatin] Muscle Pain 06/30/2014  . Lovastatin Muscle Pain 06/11/2017  . Morphine  Anxiety 10/02/2020  . Nexium [esomeprazole magnesium] Rash 06/30/2014  . Pravastatin Muscle Pain 07/15/2017  . Codeine Anxiety 07/17/2017    Past Medical History:  Diagnosis Date  . Clostridium difficile infection 08/01/2017   Treated with Flagyl   . Diverticulitis   . Essential hypertension, benign 06/30/2014  . History of radiation therapy   . Malignant neoplasm of lower-outer quadrant of right breast of female, estrogen receptor positive (CMS/HHS-HCC) 10/20/2020   T1 a, N0; ER positive, PR positive, HER-2/neu not overexpressed.  Minimal margin for invasive cancer 2 mm, DCIS 5 mm.  . Other and unspecified angina pectoris () 06/30/2014  . Other and unspecified hyperlipidemia 06/30/2014    Past Surgical History:  Procedure Laterality Date  . HYSTERECTOMY  1998   cervix still present  . CATARACT EXTRACTION Right 08/25/2014  . COLONOSCOPY  09/05/2017   Int Hemorrhoids, Diveticulosis: No repeat per RTE  . EGD  09/05/2017  . CATARACT EXTRACTION Left 03/2020  . cataract surgery Left 04/14/2020  . INCISIONAL BIOPSY BREAST Right 09/27/2020  . MASTECTOMY PARTIAL / LUMPECTOMY Right 10/20/2020   right lumpectomy  . COLONOSCOPY  06/19/2007, 07/14/1996   Normal Colon:  CBF 05/2017    Vitals:   07/01/24 1017  BP: (!) 140/80  Pulse: 72    Physical Exam  General. Well appearing; NAD; VS reviewed     HEENT: Sclera and conjunctiva clear; EOMI,  Lungs. Respirations unlabored;   Extremities: Left lateral calf: Mild erythema with central papular lesion, scab Skin. Normal color and turgor Neurologic. Alert and oriented x3   Assessment and Plan 1. Tick bite of left lower leg, initial encounter No current signs of infection.  Discussed starting antibiotics if she begins to feel bad.  She can apply cortisone to the area. -     doxycycline  (VIBRA -TABS) 100 MG tablet;  Take 1 tablet (100 mg total) by mouth 2 (two) times daily for 7 days    I have personally performed this service.  [address] [place], [place]

## 2024-07-06 ENCOUNTER — Inpatient Hospital Stay: Payer: Medicare Other | Attending: Internal Medicine | Admitting: Internal Medicine

## 2024-07-06 ENCOUNTER — Inpatient Hospital Stay: Payer: Medicare Other

## 2024-07-06 ENCOUNTER — Encounter: Payer: Self-pay | Admitting: Internal Medicine

## 2024-07-06 VITALS — BP 153/101 | HR 65 | Temp 96.0°F | Resp 16 | Ht 61.0 in | Wt 129.2 lb

## 2024-07-06 DIAGNOSIS — Z1732 Human epidermal growth factor receptor 2 negative status: Secondary | ICD-10-CM | POA: Diagnosis not present

## 2024-07-06 DIAGNOSIS — C50411 Malignant neoplasm of upper-outer quadrant of right female breast: Secondary | ICD-10-CM | POA: Diagnosis not present

## 2024-07-06 DIAGNOSIS — Z7982 Long term (current) use of aspirin: Secondary | ICD-10-CM | POA: Diagnosis not present

## 2024-07-06 DIAGNOSIS — Z79811 Long term (current) use of aromatase inhibitors: Secondary | ICD-10-CM | POA: Insufficient documentation

## 2024-07-06 DIAGNOSIS — Z923 Personal history of irradiation: Secondary | ICD-10-CM | POA: Diagnosis not present

## 2024-07-06 DIAGNOSIS — M8580 Other specified disorders of bone density and structure, unspecified site: Secondary | ICD-10-CM | POA: Insufficient documentation

## 2024-07-06 DIAGNOSIS — Z170 Estrogen receptor positive status [ER+]: Secondary | ICD-10-CM | POA: Insufficient documentation

## 2024-07-06 DIAGNOSIS — Z1721 Progesterone receptor positive status: Secondary | ICD-10-CM | POA: Diagnosis not present

## 2024-07-06 DIAGNOSIS — Z79899 Other long term (current) drug therapy: Secondary | ICD-10-CM | POA: Insufficient documentation

## 2024-07-06 DIAGNOSIS — R232 Flushing: Secondary | ICD-10-CM | POA: Diagnosis not present

## 2024-07-06 DIAGNOSIS — D72819 Decreased white blood cell count, unspecified: Secondary | ICD-10-CM | POA: Insufficient documentation

## 2024-07-06 DIAGNOSIS — N951 Menopausal and female climacteric states: Secondary | ICD-10-CM | POA: Diagnosis not present

## 2024-07-06 LAB — CMP (CANCER CENTER ONLY)
ALT: 17 U/L (ref 0–44)
AST: 22 U/L (ref 15–41)
Albumin: 4.2 g/dL (ref 3.5–5.0)
Alkaline Phosphatase: 47 U/L (ref 38–126)
Anion gap: 7 (ref 5–15)
BUN: 19 mg/dL (ref 8–23)
CO2: 29 mmol/L (ref 22–32)
Calcium: 10.3 mg/dL (ref 8.9–10.3)
Chloride: 103 mmol/L (ref 98–111)
Creatinine: 0.96 mg/dL (ref 0.44–1.00)
GFR, Estimated: >60 mL/min (ref >60)
Glucose, Bld: 124 mg/dL — ABNORMAL HIGH (ref 70–99)
Potassium: 4 mmol/L (ref 3.5–5.1)
Sodium: 139 mmol/L (ref 135–145)
Total Bilirubin: 0.7 mg/dL (ref 0.0–1.2)
Total Protein: 7.1 g/dL (ref 6.5–8.1)

## 2024-07-06 LAB — CBC WITH DIFFERENTIAL (CANCER CENTER ONLY)
Abs Immature Granulocytes: 0.01 K/uL (ref 0.00–0.07)
Basophils Absolute: 0 K/uL (ref 0.0–0.1)
Basophils Relative: 1 %
Eosinophils Absolute: 0.4 K/uL (ref 0.0–0.5)
Eosinophils Relative: 9 %
HCT: 41.9 % (ref 36.0–46.0)
Hemoglobin: 13.3 g/dL (ref 12.0–15.0)
Immature Granulocytes: 0 %
Lymphocytes Relative: 37 %
Lymphs Abs: 1.7 K/uL (ref 0.7–4.0)
MCH: 25.2 pg — ABNORMAL LOW (ref 26.0–34.0)
MCHC: 31.7 g/dL (ref 30.0–36.0)
MCV: 79.5 fL — ABNORMAL LOW (ref 80.0–100.0)
Monocytes Absolute: 0.5 K/uL (ref 0.1–1.0)
Monocytes Relative: 10 %
Neutro Abs: 2 K/uL (ref 1.7–7.7)
Neutrophils Relative %: 43 %
Platelet Count: 270 K/uL (ref 150–400)
RBC: 5.27 MIL/uL — ABNORMAL HIGH (ref 3.87–5.11)
RDW: 14.2 % (ref 11.5–15.5)
WBC Count: 4.7 K/uL (ref 4.0–10.5)
nRBC: 0 % (ref 0.0–0.2)

## 2024-07-06 NOTE — Progress Notes (Signed)
 [name] CONSULT NOTE  Patient Care Team: Alla Amis, MD as PCP - General (Family Medicine) Dannielle Arlean FALCON, RN (Inactive) as Oncology Nurse Navigator (Oncology) Rennie Cindy SAUNDERS, MD as Consulting Physician (Internal Medicine) Dessa Reyes ORN, MD as Consulting Physician (General Surgery) Lenn Aran, MD as Referring Physician (Radiation Oncology)  CHIEF COMPLAINTS/PURPOSE OF CONSULTATION: Breast cancer  #  Oncology History Overview Note  # RIGHT BREAST MUCINOUS CARCINOMA: ER/PR-POSITIVE; Her 2 NEG. PT1apN0 [ 5mm in Biopsy; 3 mm in final resection]; G-1. Dr.Byrnett; s/p MammoSite.  # MID JAN 2022- START ARIMIDEX   # DEC 2021- BMD- t score -1.8/osteopenia   # SURVIVORSHIP:   # GENETICS:   DIAGNOSIS: Right breast cancer  STAGE:   1      ;  GOALS: Cure  CURRENT/MOST RECENT THERAPY : Radiation; AI    Carcinoma of upper-outer quadrant of right breast in female, estrogen receptor positive (HCC)  10/06/2020 Initial Diagnosis   Carcinoma of upper-outer quadrant of right breast in female, estrogen receptor positive (HCC)   01/10/2021 Cancer Staging   Staging form: Breast, AJCC 8th Edition - Clinical: Stage IA (cT1b, cN0, cM0, G1, ER+, PR+, HER2-) - Signed by Nanako Stopher R, MD on 01/10/2021    HISTORY OF PRESENTING ILLNESS: Alone.  Ambulating independently.  Laura Johns 73 y.o.  female patient with right breast cancer ER/PR positive HER-2 negative currently on adjuvant anastrozole  is here for follow-up.   Patient admits to mild hot flashes.  Denies any worsening joint pains.  No nausea no vomiting.  Abdominal pain.  No new lumps or bumps.  She admits to compliance with her medications.  Review of Systems  Constitutional:  Negative for chills, diaphoresis, fever, malaise/fatigue and weight loss.  HENT:  Negative for nosebleeds and sore throat.   Eyes:  Negative for double vision.  Respiratory:  Negative for cough, hemoptysis, sputum  production, shortness of breath and wheezing.   Cardiovascular:  Negative for chest pain, palpitations, orthopnea and leg swelling.  Gastrointestinal:  Negative for abdominal pain, blood in stool, constipation, diarrhea, heartburn, melena, nausea and vomiting.  Genitourinary:  Negative for dysuria, frequency and urgency.  Musculoskeletal:  Positive for back pain and joint pain.  Skin: Negative.  Negative for itching and rash.  Neurological:  Negative for dizziness, tingling, focal weakness, weakness and headaches.  Endo/Heme/Allergies:  Does not bruise/bleed easily.  Psychiatric/Behavioral:  Negative for depression. The patient is not nervous/anxious and does not have insomnia.      MEDICAL HISTORY:  Past Medical History:  Diagnosis Date   Borderline diabetes mellitus    Breast cancer (HCC)    2021 right breast ca   Cancer [facility])    Diverticulitis    Hyperlipemia    Hypertension    Personal history of radiation therapy    2021 mammosite    SURGICAL HISTORY: Past Surgical History:  Procedure Laterality Date   ABDOMINAL HYSTERECTOMY     AXILLARY SENTINEL NODE BIOPSY Right 10/20/2020   Procedure: AXILLARY SENTINEL NODE BIOPSY;  Surgeon: Dessa Reyes ORN, MD;  Location: [facility] ORS;  Service: General;  Laterality: Right;   BREAST BIOPSY Right 09/27/2020   GRADE I INVASIVE MAMMARY CARCINOMA   BREAST LUMPECTOMY WITH NEEDLE LOCALIZATION Right 10/20/2020   Procedure: BREAST LUMPECTOMY WITH NEEDLE LOCALIZATION;  Surgeon: Dessa Reyes ORN, MD;  Location: [facility] ORS;  Service: General;  Laterality: Right;   CATARACT EXTRACTION W/PHACO Right 08/25/2014   Procedure: CATARACT EXTRACTION PHACO AND INTRAOCULAR LENS PLACEMENT (IOC);  Surgeon:  Cherene Mania, MD;  Location: [facility] ORS;  Service: Ophthalmology;  Laterality: Right;  CDE 5.82   CATARACT EXTRACTION W/PHACO Left 04/14/2020   Procedure: CATARACT EXTRACTION PHACO AND INTRAOCULAR LENS PLACEMENT LEFT EYE CDE=6.73;  Surgeon: Harrie Agent, MD;   Location: [facility] ORS;  Service: Ophthalmology;  Laterality: Left;  left   COLONOSCOPY WITH PROPOFOL  N/A 09/05/2017   Procedure: COLONOSCOPY WITH PROPOFOL ;  Surgeon: Viktoria Lamar DASEN, MD;  Location: [facility] ENDOSCOPY;  Service: Endoscopy;  Laterality: N/A;   COLONOSCOPY WITH PROPOFOL  N/A 10/09/2022   Procedure: COLONOSCOPY WITH PROPOFOL ;  Surgeon: Dessa Reyes ORN, MD;  Location: [facility] ENDOSCOPY;  Service: Endoscopy;  Laterality: N/A;   ESOPHAGOGASTRODUODENOSCOPY (EGD) WITH PROPOFOL  N/A 09/05/2017   Procedure: ESOPHAGOGASTRODUODENOSCOPY (EGD) WITH PROPOFOL ;  Surgeon: Viktoria Lamar DASEN, MD;  Location: [facility] ENDOSCOPY;  Service: Endoscopy;  Laterality: N/A;    SOCIAL HISTORY: Social History   Socioeconomic History   Marital status: Married    Spouse name: Not on file   Number of children: Not on file   Years of education: Not on file   Highest education level: Not on file  Occupational History   Not on file  Tobacco Use   Smoking status: Never   Smokeless tobacco: Never  Vaping Use   Vaping status: Never Used  Substance and Sexual Activity   Alcohol use: No   Drug use: No   Sexual activity: Yes  Other Topics Concern   Not on file  Social History Narrative   Never smoked; no alcohol; works in Engineer, maintenance. [place]. With husband at home; one daughter- 98 y.    Social Drivers of Corporate investment banker Strain: Low Risk  (08/13/2023)   Received from [facility]   Overall Financial Resource Strain (CARDIA)    Difficulty of Paying Living Expenses: Not hard at all  Food Insecurity: No Food Insecurity (08/13/2023)   Received from [facility]   Hunger Vital Sign    Within the past 12 months, you worried that your food would run out before you got the money to buy more.: Never true    Within the past 12 months, the food you bought just didn't last and you didn't have money to get more.: Never true   Transportation Needs: No Transportation Needs (08/13/2023)   Received from [facility] - Transportation    In the past 12 months, has lack of transportation kept you from medical appointments or from getting medications?: No    Lack of Transportation (Non-Medical): No  Physical Activity: Not on file  Stress: Not on file  Social Connections: Not on file  Intimate Partner Violence: Not on file    FAMILY HISTORY: Family History  Problem Relation Age of Onset   Stroke Mother    Heart attack Father    Stroke Father    Breast cancer Neg Hx     ALLERGIES:  is allergic to atorvastatin, lovastatin, pravastatin, contrast media [iodinated contrast media], morphine  and codeine, and nexium [esomeprazole magnesium].  MEDICATIONS:  Current Outpatient Medications  Medication Sig Dispense Refill   anastrozole  (ARIMIDEX ) 1 MG tablet TAKE 1 TABLET BY MOUTH DAILY 100 tablet 2   aspirin  EC 81 MG tablet Take 81 mg by mouth daily.     cholecalciferol (VITAMIN D3) 25 MCG (1000 UNIT) tablet Take 1,000 Units by mouth daily.     desonide (DESOWEN) 0.05 % ointment Apply 1 Application topically 2 (two)  times daily.     ezetimibe (ZETIA) 10 MG tablet Take 10 mg by mouth every morning.      hydrochlorothiazide  (HYDRODIURIL ) 25 MG tablet Take 25 mg by mouth every morning.      lisinopril  (ZESTRIL ) 30 MG tablet Take by mouth.     rosuvastatin (CRESTOR) 5 MG tablet Take 5 mg by mouth daily.     No current facility-administered medications for this visit.      SABRA  PHYSICAL EXAMINATION: ECOG PERFORMANCE STATUS: 0 - Asymptomatic  Vitals:   07/06/24 1417 07/06/24 1440  BP: (!) 174/91 (!) 153/101  Pulse: 65   Resp: 16   Temp: (!) 96 F (35.6 C)   SpO2: 100%    Filed Weights   07/06/24 1417  Weight: 129 lb 3.2 oz (58.6 kg)    Physical Exam HENT:     Head: Normocephalic and atraumatic.     Mouth/Throat:     Pharynx: No oropharyngeal exudate.  Eyes:     Pupils: Pupils  are equal, round, and reactive to light.  Cardiovascular:     Rate and Rhythm: Normal rate and regular rhythm.  Pulmonary:     Effort: No respiratory distress.     Breath sounds: No wheezing.  Abdominal:     General: Bowel sounds are normal. There is no distension.     Palpations: Abdomen is soft. There is no mass.     Tenderness: There is no abdominal tenderness. There is no guarding or rebound.  Musculoskeletal:        General: No tenderness. Normal range of motion.     Cervical back: Normal range of motion and neck supple.  Skin:    General: Skin is warm.  Neurological:     Mental Status: She is alert and oriented to person, place, and time.  Psychiatric:        Mood and Affect: Affect normal.      LABORATORY DATA:  I have reviewed the data as listed Lab Results  Component Value Date   WBC 4.7 07/06/2024   HGB 13.3 07/06/2024   HCT 41.9 07/06/2024   MCV 79.5 (L) 07/06/2024   PLT 270 07/06/2024   Recent Labs    07/10/23 0958 01/07/24 1249 07/06/24 1424  NA 137 138 139  K 3.6 3.6 4.0  CL 101 102 103  CO2 26 28 29   GLUCOSE 92 115* 124*  BUN 17 16 19   CREATININE 0.83 0.89 0.96  CALCIUM 9.7 9.9 10.3  GFRNONAA >60 >60 >60  PROT 7.0 6.7 7.1  ALBUMIN 4.1 4.0 4.2  AST 20 19 22   ALT 14 14 17   ALKPHOS 46 45 47  BILITOT 0.6 0.5 0.7    RADIOGRAPHIC STUDIES: I have personally reviewed the radiological images as listed and agreed with the findings in the report. No results found.  ASSESSMENT & PLAN:   Carcinoma of upper-outer quadrant of right breast in female, estrogen receptor positive (HCC) #[2021 OCT ] MUCINOUS RIGHT BREAST CANCER- STAGE I ER/PR-POSITIVE; HER-2 NEGATIVE. S/p Mammosite RT. NO adjuvant chemo.  ON  adjuvant anastrozole  [until Jan 2026].  BIL SEP 2024- mammo-WNL [CC.  Stable.   #Tolerating anastrozole  well.  No major side effects noted except for mild hot flashes.  Recommended total of 5 years - JAN 2026. Will sop at next visit.  # Hot flashes-  G-1; monitor for now.Stable.   # HTN- 160s [white coat]-; blood pressure at home 120s to 130s.  Stable.   # AUG 2023-OSTEOPENIA-  STABLE;  T-score of -2.1.  2021-osteopenic T-score of-1.8. [2018-hypercalemia]; continue on the- vit D1000/day; exercise.  Declined reclast. Will repeat BMD in aug 2025. Will order  today.   # Mild leukopenia question benign ethnic/no neutropenia.  Monitor for now.   # DISPOSITION: # mammo/BMD in sep 2025 # follow up in 6 month-- MD;labs- cbc/cmp- -Dr.B   All questions were answered. The patient/family knows to call the clinic with any problems, questions or concerns.   Cindy JONELLE Joe, MD 07/06/2024 2:59 PM

## 2024-07-06 NOTE — Assessment & Plan Note (Addendum)
#[  2021 OCT ] MUCINOUS RIGHT BREAST CANCER- STAGE I ER/PR-POSITIVE; HER-2 NEGATIVE. S/p Mammosite RT. NO adjuvant chemo.  ON  adjuvant anastrozole  [until Jan 2026].  BIL SEP 2024- mammo-WNL [CC.  Stable.   #Tolerating anastrozole  well.  No major side effects noted except for mild hot flashes.  Recommended total of 5 years - JAN 2026. Will sop at next visit.  # Hot flashes- G-1; monitor for now.Stable.   # HTN- 160s [white coat]-; blood pressure at home 120s to 130s.  Stable.   # AUG 2023-OSTEOPENIA- STABLE;  T-score of -2.1.  2021-osteopenic T-score of-1.8. [2018-hypercalemia]; continue on the- vit D1000/day; exercise.  Declined reclast. Will repeat BMD in aug 2025. Will order  today.   # Mild leukopenia question benign ethnic/no neutropenia.  Monitor for now.   # DISPOSITION: # mammo/BMD in sep 2025 # follow up in 6 month-- MD;labs- cbc/cmp- -Dr.B

## 2024-07-06 NOTE — Progress Notes (Signed)
 No concerns today

## 2024-09-16 ENCOUNTER — Ambulatory Visit
Admission: RE | Admit: 2024-09-16 | Discharge: 2024-09-16 | Disposition: A | Discharge status: Home / Self Care | Source: Ambulatory Visit | Attending: Internal Medicine

## 2024-09-16 ENCOUNTER — Ambulatory Visit
Admission: RE | Admit: 2024-09-16 | Discharge: 2024-09-16 | Disposition: A | Discharge status: Home / Self Care | Source: Ambulatory Visit | Attending: Internal Medicine | Admitting: Internal Medicine

## 2024-09-16 DIAGNOSIS — Z170 Estrogen receptor positive status [ER+]: Secondary | ICD-10-CM | POA: Diagnosis present

## 2024-09-16 DIAGNOSIS — Z13820 Encounter for screening for osteoporosis: Secondary | ICD-10-CM | POA: Diagnosis not present

## 2024-09-16 DIAGNOSIS — Z1231 Encounter for screening mammogram for malignant neoplasm of breast: Secondary | ICD-10-CM | POA: Diagnosis not present

## 2024-09-16 DIAGNOSIS — C50411 Malignant neoplasm of upper-outer quadrant of right female breast: Secondary | ICD-10-CM | POA: Diagnosis present

## 2024-09-16 DIAGNOSIS — M8589 Other specified disorders of bone density and structure, multiple sites: Secondary | ICD-10-CM | POA: Diagnosis not present

## 2024-09-16 DIAGNOSIS — Z853 Personal history of malignant neoplasm of breast: Secondary | ICD-10-CM | POA: Diagnosis not present

## 2025-01-06 ENCOUNTER — Inpatient Hospital Stay: Attending: Internal Medicine

## 2025-01-06 ENCOUNTER — Encounter: Payer: Self-pay | Admitting: Internal Medicine

## 2025-01-06 ENCOUNTER — Inpatient Hospital Stay: Admitting: Internal Medicine

## 2025-01-06 VITALS — BP 163/93 | HR 60 | Temp 96.3°F | Resp 16 | Ht 61.0 in | Wt 129.8 lb

## 2025-01-06 DIAGNOSIS — Z1721 Progesterone receptor positive status: Secondary | ICD-10-CM | POA: Insufficient documentation

## 2025-01-06 DIAGNOSIS — C50411 Malignant neoplasm of upper-outer quadrant of right female breast: Secondary | ICD-10-CM | POA: Diagnosis not present

## 2025-01-06 DIAGNOSIS — D72819 Decreased white blood cell count, unspecified: Secondary | ICD-10-CM | POA: Diagnosis not present

## 2025-01-06 DIAGNOSIS — Z17411 Hormone receptor positive with human epidermal growth factor receptor 2 negative status: Secondary | ICD-10-CM | POA: Insufficient documentation

## 2025-01-06 DIAGNOSIS — Z923 Personal history of irradiation: Secondary | ICD-10-CM | POA: Insufficient documentation

## 2025-01-06 DIAGNOSIS — Z79811 Long term (current) use of aromatase inhibitors: Secondary | ICD-10-CM | POA: Diagnosis not present

## 2025-01-06 DIAGNOSIS — Z79899 Other long term (current) drug therapy: Secondary | ICD-10-CM | POA: Diagnosis not present

## 2025-01-06 DIAGNOSIS — Z170 Estrogen receptor positive status [ER+]: Secondary | ICD-10-CM | POA: Insufficient documentation

## 2025-01-06 DIAGNOSIS — M8580 Other specified disorders of bone density and structure, unspecified site: Secondary | ICD-10-CM | POA: Insufficient documentation

## 2025-01-06 DIAGNOSIS — Z7982 Long term (current) use of aspirin: Secondary | ICD-10-CM | POA: Insufficient documentation

## 2025-01-06 DIAGNOSIS — Z1732 Human epidermal growth factor receptor 2 negative status: Secondary | ICD-10-CM | POA: Insufficient documentation

## 2025-01-06 LAB — CBC WITH DIFFERENTIAL (CANCER CENTER ONLY)
Abs Immature Granulocytes: 0.02 K/uL (ref 0.00–0.07)
Basophils Absolute: 0.1 K/uL (ref 0.0–0.1)
Basophils Relative: 1 %
Eosinophils Absolute: 0.2 K/uL (ref 0.0–0.5)
Eosinophils Relative: 5 %
HCT: 42.3 % (ref 36.0–46.0)
Hemoglobin: 13.5 g/dL (ref 12.0–15.0)
Immature Granulocytes: 1 %
Lymphocytes Relative: 39 %
Lymphs Abs: 1.7 K/uL (ref 0.7–4.0)
MCH: 25.2 pg — ABNORMAL LOW (ref 26.0–34.0)
MCHC: 31.9 g/dL (ref 30.0–36.0)
MCV: 78.9 fL — ABNORMAL LOW (ref 80.0–100.0)
Monocytes Absolute: 0.4 K/uL (ref 0.1–1.0)
Monocytes Relative: 8 %
Neutro Abs: 2 K/uL (ref 1.7–7.7)
Neutrophils Relative %: 46 %
Platelet Count: 263 K/uL (ref 150–400)
RBC: 5.36 MIL/uL — ABNORMAL HIGH (ref 3.87–5.11)
RDW: 14.5 % (ref 11.5–15.5)
WBC Count: 4.4 K/uL (ref 4.0–10.5)
nRBC: 0 % (ref 0.0–0.2)

## 2025-01-06 LAB — CMP (CANCER CENTER ONLY)
ALT: 20 U/L (ref 0–44)
AST: 26 U/L (ref 15–41)
Albumin: 4.5 g/dL (ref 3.5–5.0)
Alkaline Phosphatase: 55 U/L (ref 38–126)
Anion gap: 10 (ref 5–15)
BUN: 15 mg/dL (ref 8–23)
CO2: 28 mmol/L (ref 22–32)
Calcium: 10.7 mg/dL — ABNORMAL HIGH (ref 8.9–10.3)
Chloride: 102 mmol/L (ref 98–111)
Creatinine: 1.06 mg/dL — ABNORMAL HIGH (ref 0.44–1.00)
GFR, Estimated: 55 mL/min — ABNORMAL LOW
Glucose, Bld: 98 mg/dL (ref 70–99)
Potassium: 4.2 mmol/L (ref 3.5–5.1)
Sodium: 140 mmol/L (ref 135–145)
Total Bilirubin: 0.5 mg/dL (ref 0.0–1.2)
Total Protein: 7.4 g/dL (ref 6.5–8.1)

## 2025-01-06 MED ORDER — ANASTROZOLE 1 MG PO TABS: 1.0000 mg | ORAL_TABLET | Freq: Every day | ORAL | 2 refills | Status: AC

## 2025-01-06 NOTE — Progress Notes (Signed)
 Bone density and mammogram 09/16/24.

## 2025-01-06 NOTE — Progress Notes (Signed)
 [facility] CONSULT NOTE  Patient Care Team: Alla Amis, MD as PCP - General (Family Medicine) Dannielle Arlean FALCON, RN (Inactive) as Oncology Nurse Navigator (Oncology) Rennie Cindy SAUNDERS, MD as Consulting Physician (Oncology) Dessa, Reyes ORN, MD as Consulting Physician (General Surgery) Lenn Aran, MD as Referring Physician (Radiation Oncology)  CHIEF COMPLAINTS/PURPOSE OF CONSULTATION: Breast cancer  #  Oncology History Overview Note  # RIGHT BREAST MUCINOUS CARCINOMA: ER/PR-POSITIVE; Her 2 NEG. PT1apN0 [ 5mm in Biopsy; 3 mm in final resection]; G-1. Dr.Byrnett; s/p MammoSite.  # MID JAN 2022- START ARIMIDEX   # DEC 2021- BMD- t score -1.8/osteopenia   # SURVIVORSHIP:   # GENETICS:   DIAGNOSIS: Right breast cancer  STAGE:   1      ;  GOALS: Cure  CURRENT/MOST RECENT THERAPY : Radiation; AI    Carcinoma of upper-outer quadrant of right breast in Johns, estrogen receptor positive (HCC)  10/06/2020 Initial Diagnosis   Carcinoma of upper-outer quadrant of right breast in Johns, estrogen receptor positive (HCC)   01/10/2021 Cancer Staging   Staging form: Breast, AJCC 8th Edition - Clinical: Stage IA (cT1b, cN0, cM0, G1, ER+, PR+, HER2-) - Signed by Kaiah Hosea R, MD on 01/10/2021    HISTORY OF PRESENTING ILLNESS: Alone.  Ambulating independently.  Laura Johns 74 y.o.  Johns patient with right breast cancer ER/PR positive HER-2 negative currently on adjuvant anastrozole  is here for follow-up.   Discussed the use of AI scribe software for clinical note transcription with the patient, who gave verbal consent to proceed.  History of Present Illness   Laura Johns is a 74 year old Johns with early stage, estrogen receptor positive right breast cancer, status post surgery and on adjuvant anastrozole , who presents for routine oncology follow-up.  She is in her fourth year of adjuvant anastrozole  therapy, planned for five years, and  tolerates treatment without significant adverse effects. She denies hot flashes, arthralgias, bone pain, or any new symptoms. She reports overall well-being, maintaining her usual activities including work and caregiving for her great-grandchildren.  Recent surveillance mammogram was normal. Bone density testing demonstrated mild osteopenia. She takes vitamin D supplementation but not calcium. She denies new bone pain or fractures. Laboratory evaluation revealed mildly elevated calcium.     Review of Systems  Constitutional:  Negative for chills, diaphoresis, fever, malaise/fatigue and weight loss.  HENT:  Negative for nosebleeds and sore throat.   Eyes:  Negative for double vision.  Respiratory:  Negative for cough, hemoptysis, sputum production, shortness of breath and wheezing.   Cardiovascular:  Negative for chest pain, palpitations, orthopnea and leg swelling.  Gastrointestinal:  Negative for abdominal pain, blood in stool, constipation, diarrhea, heartburn, melena, nausea and vomiting.  Genitourinary:  Negative for dysuria, frequency and urgency.  Musculoskeletal:  Positive for back pain and joint pain.  Skin: Negative.  Negative for itching and rash.  Neurological:  Negative for dizziness, tingling, focal weakness, weakness and headaches.  Endo/Heme/Allergies:  Does not bruise/bleed easily.  Psychiatric/Behavioral:  Negative for depression. The patient is not nervous/anxious and does not have insomnia.      MEDICAL HISTORY:  Past Medical History:  Diagnosis Date   Borderline diabetes mellitus    Breast cancer (HCC)    2021 right breast ca   Cancer [facility])    Diverticulitis    Hyperlipemia    Hypertension    Personal history of radiation therapy    2021 mammosite    SURGICAL HISTORY: Past Surgical History:  Procedure  Laterality Date   ABDOMINAL HYSTERECTOMY     AXILLARY SENTINEL NODE BIOPSY Right 10/20/2020   Procedure: AXILLARY SENTINEL NODE BIOPSY;  Surgeon: Dessa Reyes ORN, MD;  Location: [facility] ORS;  Service: General;  Laterality: Right;   BREAST BIOPSY Right 09/27/2020   GRADE I INVASIVE MAMMARY CARCINOMA   BREAST LUMPECTOMY WITH NEEDLE LOCALIZATION Right 10/20/2020   Procedure: BREAST LUMPECTOMY WITH NEEDLE LOCALIZATION;  Surgeon: Dessa Reyes ORN, MD;  Location: [facility] ORS;  Service: General;  Laterality: Right;   CATARACT EXTRACTION W/PHACO Right 08/25/2014   Procedure: CATARACT EXTRACTION PHACO AND INTRAOCULAR LENS PLACEMENT (IOC);  Surgeon: Cherene Mania, MD;  Location: [facility] ORS;  Service: Ophthalmology;  Laterality: Right;  CDE 5.82   CATARACT EXTRACTION W/PHACO Left 04/14/2020   Procedure: CATARACT EXTRACTION PHACO AND INTRAOCULAR LENS PLACEMENT LEFT EYE CDE=6.73;  Surgeon: Harrie Agent, MD;  Location: [facility] ORS;  Service: Ophthalmology;  Laterality: Left;  left   COLONOSCOPY WITH PROPOFOL  N/A 09/05/2017   Procedure: COLONOSCOPY WITH PROPOFOL ;  Surgeon: Viktoria Lamar DASEN, MD;  Location: [facility] ENDOSCOPY;  Service: Endoscopy;  Laterality: N/A;   COLONOSCOPY WITH PROPOFOL  N/A 10/09/2022   Procedure: COLONOSCOPY WITH PROPOFOL ;  Surgeon: Dessa Reyes ORN, MD;  Location: [facility] ENDOSCOPY;  Service: Endoscopy;  Laterality: N/A;   ESOPHAGOGASTRODUODENOSCOPY (EGD) WITH PROPOFOL  N/A 09/05/2017   Procedure: ESOPHAGOGASTRODUODENOSCOPY (EGD) WITH PROPOFOL ;  Surgeon: Viktoria Lamar DASEN, MD;  Location: [facility] ENDOSCOPY;  Service: Endoscopy;  Laterality: N/A;    SOCIAL HISTORY: Social History   Socioeconomic History   Marital status: Married    Spouse name: Not on file   Number of children: Not on file   Years of education: Not on file   Highest education level: Not on file  Occupational History   Not on file  Tobacco Use   Smoking status: Never   Smokeless tobacco: Never  Vaping Use   Vaping status: Never Used  Substance and Sexual Activity   Alcohol use: No   Drug use: No   Sexual activity: Yes  Other Topics Concern   Not on file  Social History Narrative    Never smoked; no alcohol; works in engineer, maintenance. Lives in [place]; [place] address. With husband at home; one daughter- 44 y.    Social Drivers of Health   Tobacco Use: Low Risk (01/06/2025)   Patient History    Smoking Tobacco Use: Never    Smokeless Tobacco Use: Never    Passive Exposure: Not on file  Financial Resource Strain: Low Risk  (12/10/2024)   Received from [facility]   Overall Financial Resource Strain (CARDIA)    Difficulty of Paying Living Expenses: Not hard at all  Food Insecurity: No Food Insecurity (12/10/2024)   Received from [facility]   Epic    Within the past 12 months, you worried that your food would run out before you got the money to buy more.: Never true    Within the past 12 months, the food you bought just didn't last and you didn't have money to get more.: Never true  Transportation Needs: No Transportation Needs (12/10/2024)   Received from [facility] - Transportation    In the past 12 months, has lack of transportation kept you from medical appointments or from getting medications?: No    Lack of Transportation (Non-Medical): No  Physical Activity: Not on file  Stress: Not on file  Social Connections: Not on file  Intimate Partner Violence:  Not on file  Depression (PHQ2-9): Low Risk (01/06/2025)   Depression (PHQ2-9)    PHQ-2 Score: 0  Alcohol Screen: Not on file  Housing: Low Risk  (12/10/2024)   Received from [facility]   Epic    In the last 12 months, was there a time when you were not able to pay the mortgage or rent on time?: No    In the past 12 months, how many times have you moved where you were living?: 0    At any time in the past 12 months, were you homeless or living in a shelter (including now)?: No  Utilities: Not At Risk (08/13/2023)   Received from [facility] Utilities    Threatened with loss of  utilities: No  Health Literacy: Not on file    FAMILY HISTORY: Family History  Problem Relation Age of Onset   Stroke Mother    Heart attack Father    Stroke Father    Breast cancer Neg Hx     ALLERGIES:  is allergic to atorvastatin, lovastatin, pravastatin, contrast media [iodinated contrast media], morphine  and codeine, and nexium [esomeprazole magnesium].  MEDICATIONS:  Current Outpatient Medications  Medication Sig Dispense Refill   aspirin  EC 81 MG tablet Take 81 mg by mouth daily.     cholecalciferol (VITAMIN D3) 25 MCG (1000 UNIT) tablet Take 1,000 Units by mouth daily.     desonide (DESOWEN) 0.05 % ointment Apply 1 Application topically 2 (two) times daily.     ezetimibe (ZETIA) 10 MG tablet Take 10 mg by mouth every morning.      hydrochlorothiazide  (HYDRODIURIL ) 25 MG tablet Take 25 mg by mouth every morning.      lisinopril  (ZESTRIL ) 30 MG tablet Take 30 mg by mouth daily.     rosuvastatin (CRESTOR) 5 MG tablet Take 5 mg by mouth daily.     anastrozole  (ARIMIDEX ) 1 MG tablet Take 1 tablet (1 mg total) by mouth daily. 100 tablet 2   No current facility-administered medications for this visit.      SABRA  PHYSICAL EXAMINATION: ECOG PERFORMANCE STATUS: 0 - Asymptomatic  Vitals:   01/06/25 0952 01/06/25 1011  BP: (!) 168/89 (!) 163/93  Pulse: 60   Resp: 16   Temp: (!) 96.3 F (35.7 C)   SpO2: 100%    Filed Weights   01/06/25 0952  Weight: 129 lb 12.8 oz (58.9 kg)    Physical Exam HENT:     Head: Normocephalic and atraumatic.     Mouth/Throat:     Pharynx: No oropharyngeal exudate.  Eyes:     Pupils: Pupils are equal, round, and reactive to light.  Cardiovascular:     Rate and Rhythm: Normal rate and regular rhythm.  Pulmonary:     Effort: No respiratory distress.     Breath sounds: No wheezing.  Abdominal:     General: Bowel sounds are normal. There is no distension.     Palpations: Abdomen is soft. There is no mass.     Tenderness: There is no  abdominal tenderness. There is no guarding or rebound.  Musculoskeletal:        General: No tenderness. Normal range of motion.     Cervical back: Normal range of motion and neck supple.  Skin:    General: Skin is warm.  Neurological:     Mental Status: She is alert and oriented to person, place, and time.  Psychiatric:  Mood and Affect: Affect normal.      LABORATORY DATA:  I have reviewed the data as listed Lab Results  Component Value Date   WBC 4.4 01/06/2025   HGB 13.5 01/06/2025   HCT 42.3 01/06/2025   MCV 78.9 (L) 01/06/2025   PLT 263 01/06/2025   Recent Labs    01/07/24 1249 07/06/24 1424 01/06/25 0948  NA 138 139 140  K 3.6 4.0 4.2  CL 102 103 102  CO2 28 29 28   GLUCOSE 115* 124* 98  BUN 16 19 15   CREATININE 0.89 0.96 1.06*  CALCIUM 9.9 10.3 10.7*  GFRNONAA >60 >60 55*  PROT 6.7 7.1 7.4  ALBUMIN 4.0 4.2 4.5  AST 19 22 26   ALT 14 17 20   ALKPHOS 45 47 55  BILITOT 0.5 0.7 0.5    RADIOGRAPHIC STUDIES: I have personally reviewed the radiological images as listed and agreed with the findings in the report. No results found.  ASSESSMENT & PLAN:   Carcinoma of upper-outer quadrant of right breast in Johns, estrogen receptor positive (HCC) #[2021 OCT ] MUCINOUS RIGHT BREAST CANCER- STAGE I ER/PR-POSITIVE; HER-2 NEGATIVE. S/p Mammosite RT. NO adjuvant chemo.  ON  adjuvant anastrozole  [JAN 2022-Jan 2027].  BIL SEP 2025- mammo-WNL [CC.  Stable.   #Tolerating anastrozole  well.  No major side effects noted except for mild hot flashes.  Recommended total of 5 years - until JAN 2027   # Hypercalcemia- 10.7- ? Hydrochlorothiazide . Low concerns of malignant hypercalcemia.continue Vit D. Awaiting repeat labs with PCP in feb 2026.   # Hot flashes-resolved.    # HTN- 160s [white coat]-; blood pressure at home 120s to 130s.  Stable.   # AUG 2023-OSTEOPENIA- STABLE;  T-score of -2.1.  2021-osteopenic T-score of-1.8. [2018-hypercalemia]; continue on the- vit  D1000/day; exercise.  Declined reclast.   BMD in SEP 2025- OSTEOPENIA- stable.   # Mild leukopenia question benign ethnic/no neutropenia.  Monitor for now.Stable.    # DISPOSITION: # follow up in 6 month-- MD;labs- cbc/cmp; vit D 25- [place]- -Dr.B   All questions were answered. The patient/family knows to call the clinic with any problems, questions or concerns.   Cindy JONELLE Joe, MD 01/06/2025 10:40 AM

## 2025-01-06 NOTE — Assessment & Plan Note (Addendum)
#[  2021 OCT ] MUCINOUS RIGHT BREAST CANCER- STAGE I ER/PR-POSITIVE; HER-2 NEGATIVE. S/p Mammosite RT. NO adjuvant chemo.  ON  adjuvant anastrozole  [JAN 2022-Jan 2027].  BIL SEP 2025- mammo-WNL [CC.  Stable.   #Tolerating anastrozole  well.  No major side effects noted except for mild hot flashes.  Recommended total of 5 years - until JAN 2027   # Hypercalcemia- 10.7- ? Hydrochlorothiazide . Low concerns of malignant hypercalcemia.continue Vit D. Awaiting repeat labs with PCP in feb 2026.   # Hot flashes-resolved.    # HTN- 160s [white coat]-; blood pressure at home 120s to 130s.  Stable.   # AUG 2023-OSTEOPENIA- STABLE;  T-score of -2.1.  2021-osteopenic T-score of-1.8. [2018-hypercalemia]; continue on the- vit D1000/day; exercise.  Declined reclast.   BMD in SEP 2025- OSTEOPENIA- stable.   # Mild leukopenia question benign ethnic/no neutropenia.  Monitor for now.Stable.    # DISPOSITION: # follow up in 6 month-- MD;labs- cbc/cmp; vit D 25- [place]- -Dr.B

## 2025-07-11 ENCOUNTER — Inpatient Hospital Stay: Admitting: Internal Medicine

## 2025-07-11 ENCOUNTER — Inpatient Hospital Stay
